# Patient Record
Sex: Male | Born: 1945 | Race: Black or African American | Hispanic: No | Marital: Married | Smoking: Former smoker
Health system: Southern US, Community
[De-identification: ages and names within clinical notes are randomized; demographics above are authoritative.]

## PROBLEM LIST (undated history)

## (undated) DIAGNOSIS — D509 Iron deficiency anemia, unspecified: Secondary | ICD-10-CM

## (undated) DIAGNOSIS — J441 Chronic obstructive pulmonary disease with (acute) exacerbation: Secondary | ICD-10-CM

## (undated) DIAGNOSIS — J449 Chronic obstructive pulmonary disease, unspecified: Secondary | ICD-10-CM

## (undated) DIAGNOSIS — I10 Essential (primary) hypertension: Secondary | ICD-10-CM

## (undated) DIAGNOSIS — F329 Major depressive disorder, single episode, unspecified: Secondary | ICD-10-CM

## (undated) DIAGNOSIS — N039 Chronic nephritic syndrome with unspecified morphologic changes: Secondary | ICD-10-CM

## (undated) DIAGNOSIS — D631 Anemia in chronic kidney disease: Secondary | ICD-10-CM

## (undated) DIAGNOSIS — N189 Chronic kidney disease, unspecified: Secondary | ICD-10-CM

## (undated) DIAGNOSIS — E119 Type 2 diabetes mellitus without complications: Secondary | ICD-10-CM

## (undated) DIAGNOSIS — R609 Edema, unspecified: Secondary | ICD-10-CM

## (undated) DIAGNOSIS — M545 Low back pain: Secondary | ICD-10-CM

## (undated) HISTORY — DX: Chronic obstructive pulmonary disease with (acute) exacerbation: J44.1

## (undated) HISTORY — DX: Chronic nephritic syndrome with unspecified morphologic changes: N03.9

## (undated) HISTORY — DX: Major depressive disorder, single episode, unspecified: F32.9

## (undated) HISTORY — DX: Edema, unspecified: R60.9

## (undated) HISTORY — DX: Iron deficiency anemia, unspecified: D50.9

## (undated) HISTORY — DX: Low back pain: M54.5

## (undated) HISTORY — DX: Anemia in chronic kidney disease: D63.1

## (undated) HISTORY — DX: Chronic obstructive pulmonary disease, unspecified: J44.9

## (undated) HISTORY — DX: Chronic kidney disease, unspecified: N18.9

## (undated) HISTORY — DX: Type 2 diabetes mellitus without complications: E11.9

## (undated) HISTORY — DX: Essential (primary) hypertension: I10

---

## 2002-12-21 ENCOUNTER — Ambulatory Visit (HOSPITAL_COMMUNITY): Admission: RE | Admit: 2002-12-21 | Discharge: 2002-12-21 | Payer: Self-pay | Admitting: Unknown Physician Specialty

## 2008-11-06 ENCOUNTER — Emergency Department (HOSPITAL_COMMUNITY): Admission: EM | Admit: 2008-11-06 | Discharge: 2008-11-06 | Payer: Self-pay | Admitting: Emergency Medicine

## 2008-11-18 ENCOUNTER — Ambulatory Visit (HOSPITAL_COMMUNITY): Admission: RE | Admit: 2008-11-18 | Discharge: 2008-11-18 | Payer: Self-pay | Admitting: Internal Medicine

## 2010-03-30 ENCOUNTER — Ambulatory Visit (HOSPITAL_COMMUNITY): Admission: RE | Admit: 2010-03-30 | Discharge: 2010-03-30 | Payer: Self-pay | Admitting: Internal Medicine

## 2010-04-18 ENCOUNTER — Emergency Department (HOSPITAL_COMMUNITY): Admission: EM | Admit: 2010-04-18 | Discharge: 2010-04-18 | Payer: Self-pay | Admitting: Emergency Medicine

## 2010-08-29 LAB — BLOOD GAS, ARTERIAL
Drawn by: 129711
O2 Content: 4 L/min
Patient temperature: 98.6
pCO2 arterial: 75.7 mmHg (ref 35.0–45.0)
pH, Arterial: 7.347 — ABNORMAL LOW (ref 7.350–7.450)

## 2010-09-24 LAB — BLOOD GAS, ARTERIAL
Acid-Base Excess: 8.6 mmol/L — ABNORMAL HIGH (ref 0.0–2.0)
Drawn by: 242311
O2 Content: 2 L/min
O2 Saturation: 97.8 %
TCO2: 35.6 mmol/L (ref 0–100)

## 2011-10-23 IMAGING — CR DG CHEST 2V
3 series · 3 of 3 positions shown · non-contrast
Comparison: 11/06/2008

CLINICAL DATA: Wheezing, shortness of breath, cough

CHEST - 2 VIEW

[w chest lat]
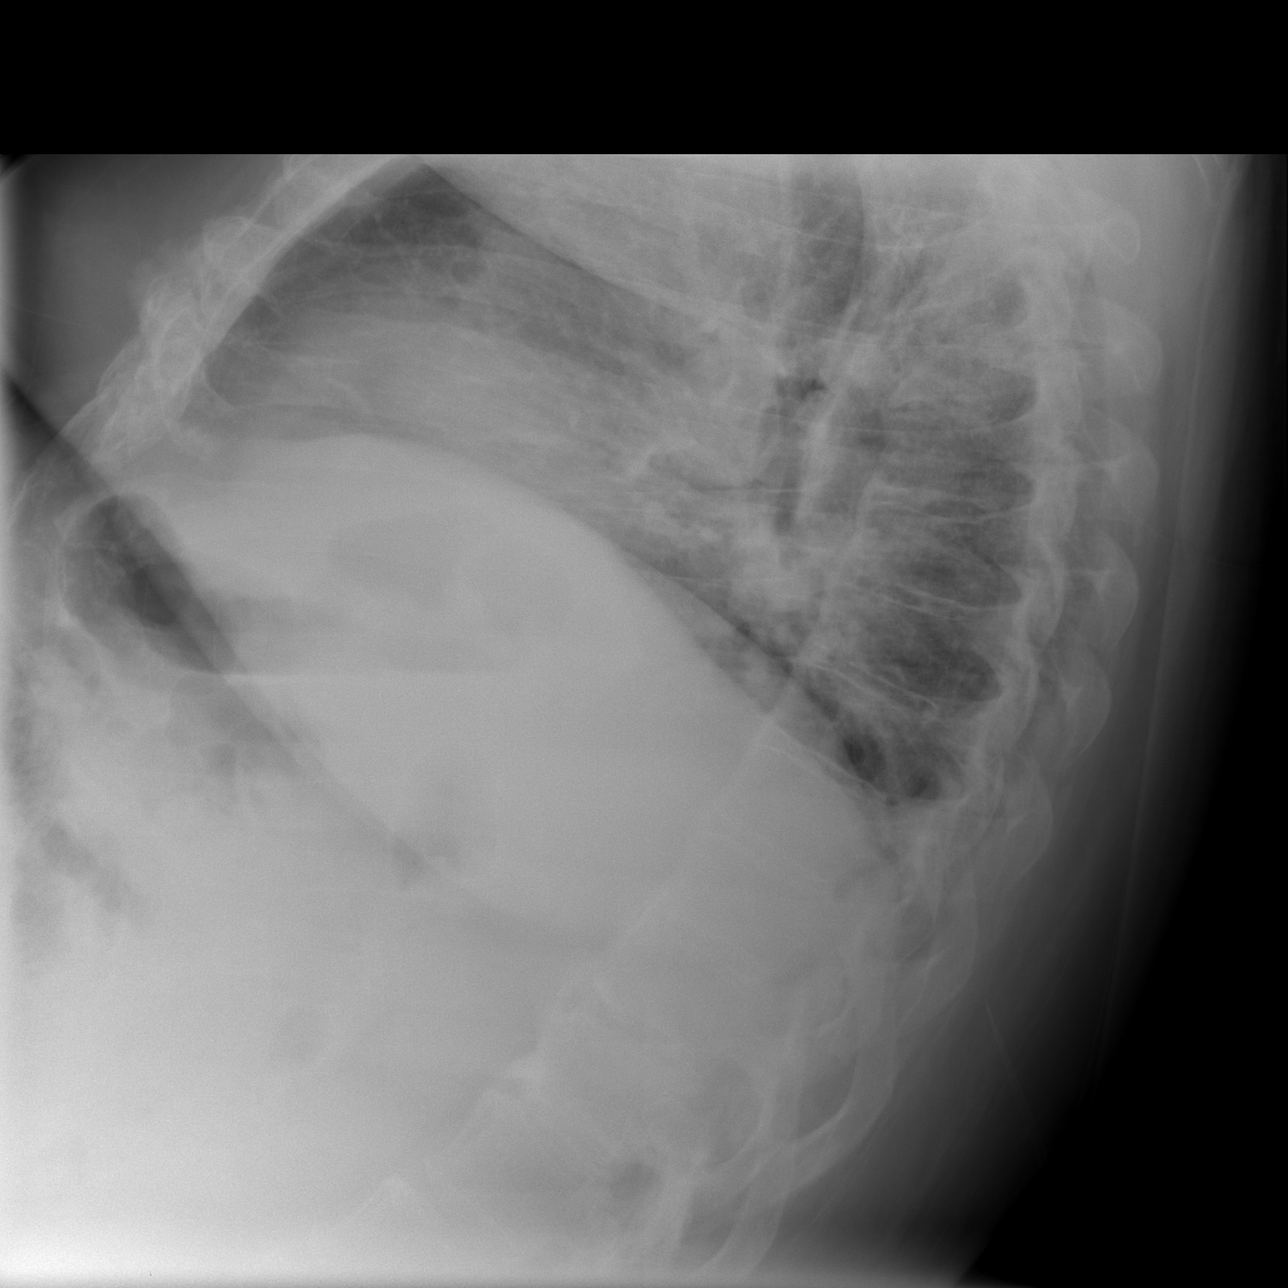

[view not recorded (1 of 2)]
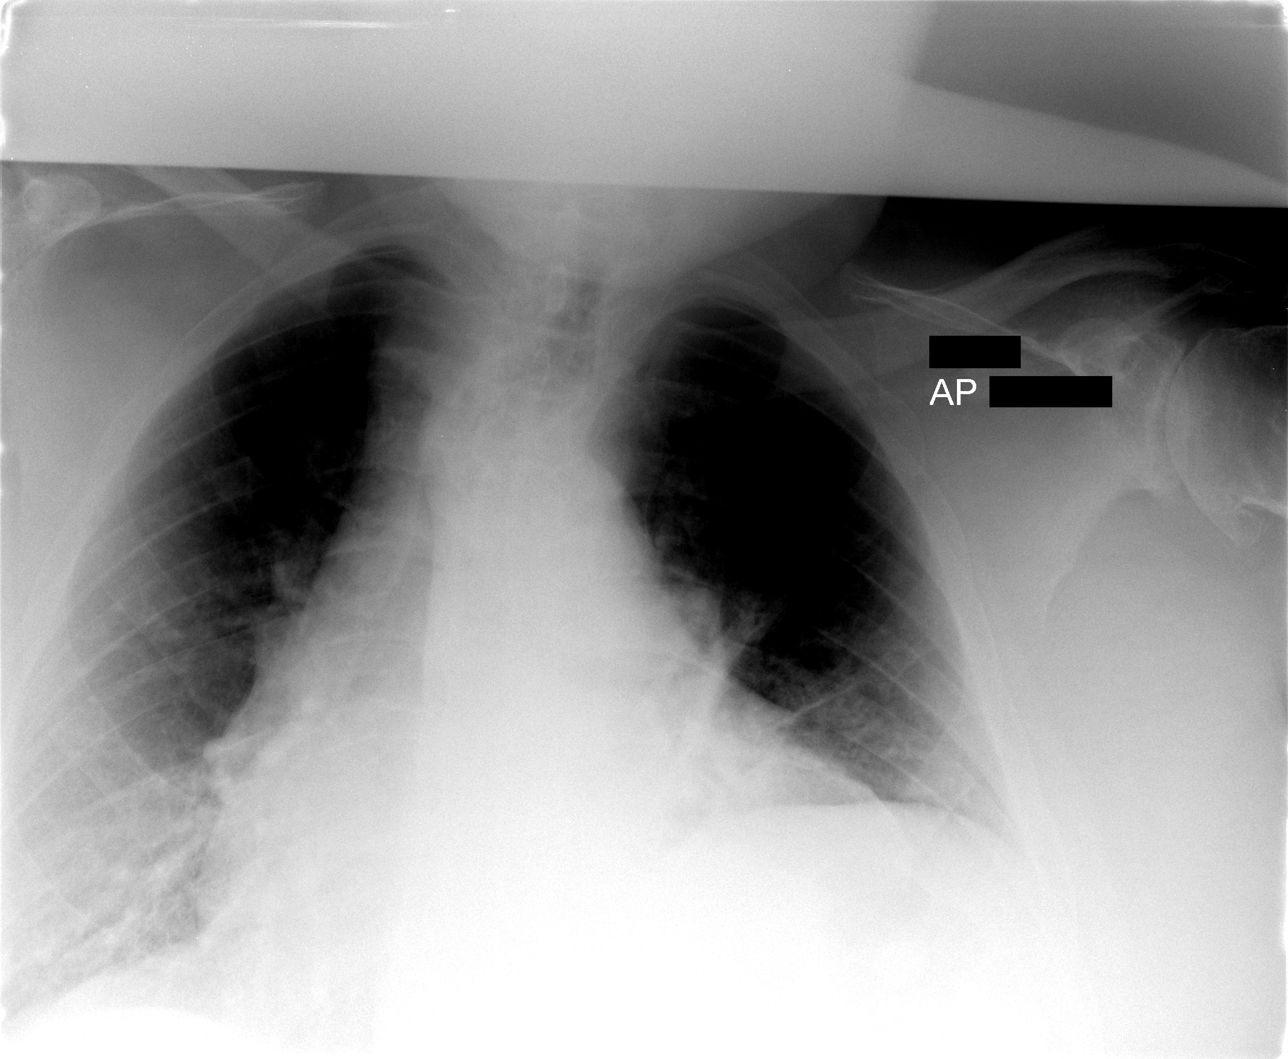

[view not recorded (2 of 2)]
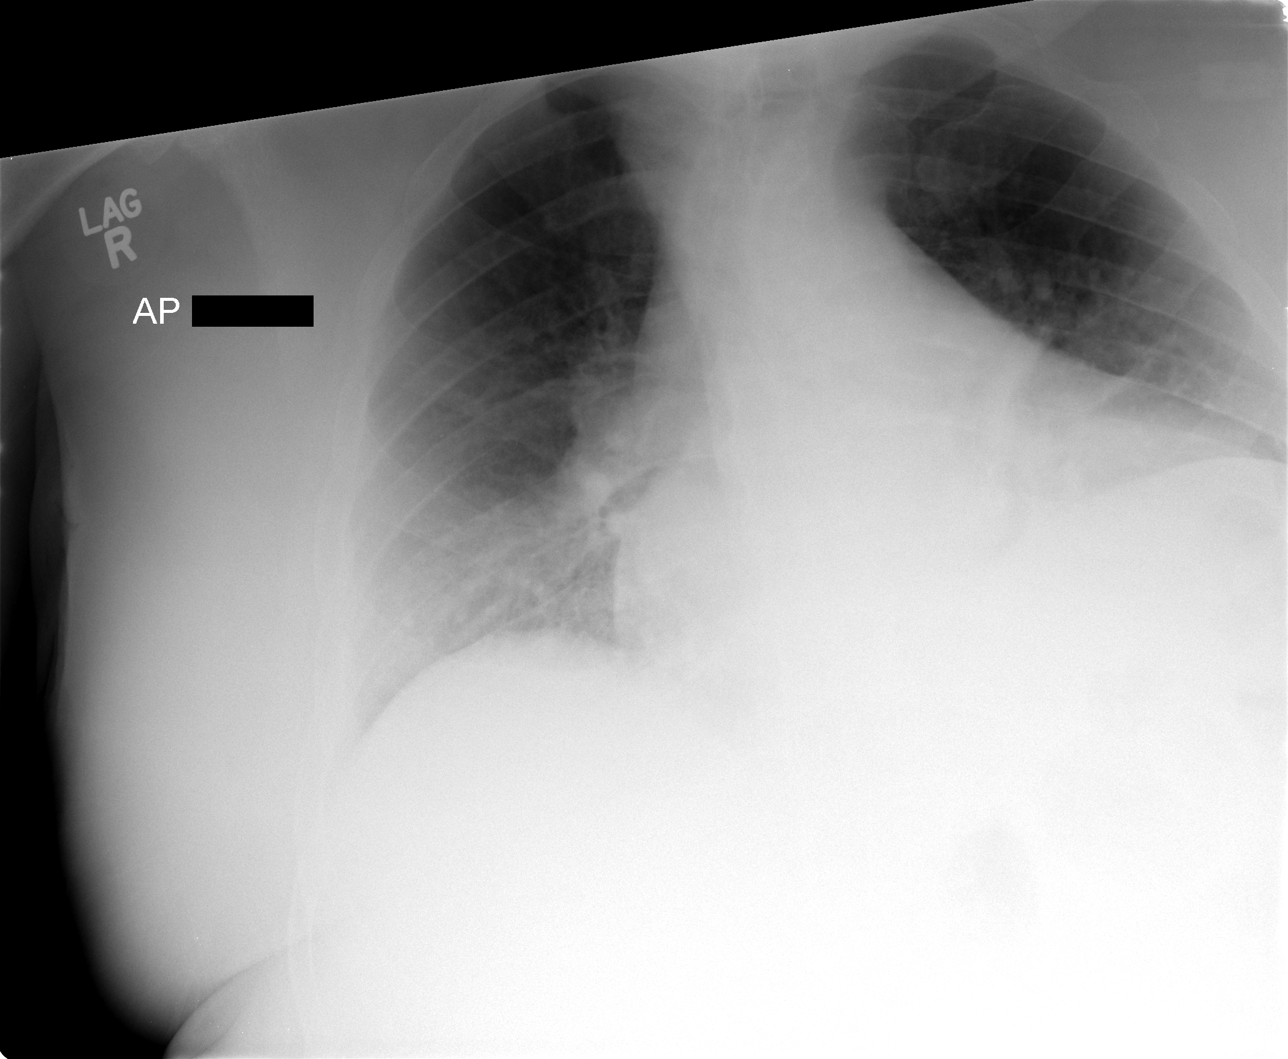

[3 of 3 positions shown; findings below may reference images not displayed]

FINDINGS: Enlargement of cardiac silhouette.
Tortuous aorta.
Pulmonary vascularity normal.
Low lung volumes with bibasilar atelectasis.
Upper lungs clear.
IMPRESSION: Enlargement of cardiac silhouette.
Low lung volumes with bibasilar atelectasis.

## 2012-09-21 ENCOUNTER — Non-Acute Institutional Stay (SKILLED_NURSING_FACILITY): Payer: Medicare Other | Admitting: Internal Medicine

## 2012-09-21 DIAGNOSIS — D638 Anemia in other chronic diseases classified elsewhere: Secondary | ICD-10-CM

## 2012-09-21 DIAGNOSIS — N179 Acute kidney failure, unspecified: Secondary | ICD-10-CM

## 2012-10-01 ENCOUNTER — Encounter: Payer: Self-pay | Admitting: Adult Health

## 2012-10-01 ENCOUNTER — Non-Acute Institutional Stay (SKILLED_NURSING_FACILITY): Payer: Medicare Other | Admitting: Adult Health

## 2012-10-01 DIAGNOSIS — E785 Hyperlipidemia, unspecified: Secondary | ICD-10-CM

## 2012-10-01 DIAGNOSIS — G4730 Sleep apnea, unspecified: Secondary | ICD-10-CM

## 2012-10-01 DIAGNOSIS — E119 Type 2 diabetes mellitus without complications: Secondary | ICD-10-CM

## 2012-10-01 DIAGNOSIS — K219 Gastro-esophageal reflux disease without esophagitis: Secondary | ICD-10-CM

## 2012-10-01 DIAGNOSIS — F329 Major depressive disorder, single episode, unspecified: Secondary | ICD-10-CM | POA: Insufficient documentation

## 2012-10-01 DIAGNOSIS — R609 Edema, unspecified: Secondary | ICD-10-CM

## 2012-10-01 DIAGNOSIS — F32A Depression, unspecified: Secondary | ICD-10-CM | POA: Insufficient documentation

## 2012-10-01 DIAGNOSIS — J441 Chronic obstructive pulmonary disease with (acute) exacerbation: Secondary | ICD-10-CM

## 2012-10-01 DIAGNOSIS — K5900 Constipation, unspecified: Secondary | ICD-10-CM | POA: Insufficient documentation

## 2012-10-01 DIAGNOSIS — J449 Chronic obstructive pulmonary disease, unspecified: Secondary | ICD-10-CM

## 2012-10-01 DIAGNOSIS — J4489 Other specified chronic obstructive pulmonary disease: Secondary | ICD-10-CM

## 2012-10-01 DIAGNOSIS — N039 Chronic nephritic syndrome with unspecified morphologic changes: Secondary | ICD-10-CM

## 2012-10-01 DIAGNOSIS — I10 Essential (primary) hypertension: Secondary | ICD-10-CM

## 2012-10-01 DIAGNOSIS — D631 Anemia in chronic kidney disease: Secondary | ICD-10-CM

## 2012-10-01 HISTORY — DX: Essential (primary) hypertension: I10

## 2012-10-01 HISTORY — DX: Chronic obstructive pulmonary disease with (acute) exacerbation: J44.1

## 2012-10-05 NOTE — Progress Notes (Signed)
Patient ID: Xavier Gibson, male   DOB: 1945/12/06, 67 y.o.   MRN: [id number]        PROGRESS NOTE  DATE:   09/21/2012  FACILITY:  [facility]   LEVEL OF CARE: SNF  Acute Visit  CHIEF COMPLAINT:  Manage acute renal failure and anemia of chronic disease.    HISTORY OF PRESENT ILLNESS: I was requested by the staff to assess the patient regarding above problem(s):  ACUTE RENAL FAILURE:  On 09/18/2012, patient's BUN was 40, creatinine 1.57.  In 06/2012, BUN was 27, creatinine 1.45.  Patient denies increasing lower extremity swelling or confusion.  He does have chronic lower extremity swelling.    ANEMIA:  On 09/18/2012, patient's hemoglobin was 9.3, MCV 91.  In 03/2012, hemoglobin was 9.  The anemia has been stable. The patient denies fatigue, melena or hematochezia. No complications from the medications currently being used.    PAST MEDICAL HISTORY : Reviewed.  No changes.  CURRENT MEDICATIONS: Reviewed per [facility]  REVIEW OF SYSTEMS:  GENERAL: no change in appetite, no fatigue, no weight changes, no fever, chills or weakness RESPIRATORY: no cough, SOB, DOE,, wheezing, hemoptysis CARDIAC: no chest pain, palpitations, chronic lower extremity swelling present GI: no abdominal pain, diarrhea, constipation, heart burn, nausea or vomiting  PHYSICAL EXAMINATION  GENERAL: no acute distress, morbidly obese body habitus EYES: conjunctivae normal, sclerae normal, normal eye lids NECK: supple, trachea midline, no neck masses, no thyroid tenderness, no thyromegaly LYMPHATICS: no LAN in the neck, no supraclavicular LAN RESPIRATORY: breathing is even & unlabored, BS CTAB CARDIAC: RRR, no murmur,no extra heart sounds  EDEMA/VARICOSITIES:  +4 bilateral lower extremity edema  ARTERIAL:  pedal pulses nonpalpable  GI: abdomen soft, normal BS, no masses, no tenderness, no hepatomegaly, no splenomegaly PSYCHIATRIC: the patient is alert & oriented to person, affect & behavior  appropriate  ASSESSMENT/PLAN:  Acute renal failure.  New problem.  Reassess.   Anemia of chronic disease.  Hemoglobin improved.  Continue iron.  CPT CODE: 40981

## 2012-10-19 ENCOUNTER — Non-Acute Institutional Stay (SKILLED_NURSING_FACILITY): Payer: Medicare Other | Admitting: Internal Medicine

## 2012-10-19 DIAGNOSIS — E119 Type 2 diabetes mellitus without complications: Secondary | ICD-10-CM

## 2012-10-19 DIAGNOSIS — J449 Chronic obstructive pulmonary disease, unspecified: Secondary | ICD-10-CM

## 2012-10-19 DIAGNOSIS — D509 Iron deficiency anemia, unspecified: Secondary | ICD-10-CM

## 2012-10-19 DIAGNOSIS — F329 Major depressive disorder, single episode, unspecified: Secondary | ICD-10-CM

## 2012-11-04 DIAGNOSIS — E119 Type 2 diabetes mellitus without complications: Secondary | ICD-10-CM

## 2012-11-04 DIAGNOSIS — J4489 Other specified chronic obstructive pulmonary disease: Secondary | ICD-10-CM | POA: Insufficient documentation

## 2012-11-04 DIAGNOSIS — D509 Iron deficiency anemia, unspecified: Secondary | ICD-10-CM | POA: Insufficient documentation

## 2012-11-04 DIAGNOSIS — J449 Chronic obstructive pulmonary disease, unspecified: Secondary | ICD-10-CM

## 2012-11-04 DIAGNOSIS — F329 Major depressive disorder, single episode, unspecified: Secondary | ICD-10-CM

## 2012-11-04 HISTORY — DX: Type 2 diabetes mellitus without complications: E11.9

## 2012-11-04 HISTORY — DX: Iron deficiency anemia, unspecified: D50.9

## 2012-11-04 HISTORY — DX: Chronic obstructive pulmonary disease, unspecified: J44.9

## 2012-11-04 HISTORY — DX: Major depressive disorder, single episode, unspecified: F32.9

## 2012-11-04 NOTE — Progress Notes (Signed)
Patient ID: Xavier Gibson, male   DOB: 15-Aug-1945, 67 y.o.   MRN: [id number]        PROGRESS NOTE  DATE: 10/19/2012  FACILITY: Nursing Home Location: [facility]  LEVEL OF CARE: SNF (31)  Routine Visit  CHIEF COMPLAINT:  Manage depression, COPD, and diabetes mellitus.    HISTORY OF PRESENT ILLNESS:  REASSESSMENT OF ONGOING PROBLEM(S):  DEPRESSION: I was requested by the pharmacy consultant to assess the patient for possible dose reduction of Celexa 20 mg q.d.  Patient states he is intermittently depressed.  The depression remains stable. Patient denies ongoing feelings of sadness, insomnia, anedhonia or lack of appetite. No complications reported from the medications currently being used. Staff do not report behavioral problems.   COPD: the COPD remains stable.  Pt denies sob, cough, wheezing or declining exercise tolerance.  No complications from the medications presently being used.   DM:pt's DM remains stable.  Pt denies polyuria, polydipsia, polyphagia, changes in vision or hypoglycemic episodes.  No complications noted from the medication presently being used.  Last hemoglobin A1c is: 09/2012:  Hemoglobin A1C 6.6  PAST MEDICAL HISTORY : Reviewed.  No changes.  CURRENT MEDICATIONS: Reviewed per [facility]  REVIEW OF SYSTEMS:  GENERAL: no change in appetite, no fatigue, no weight changes, no fever, chills or weakness RESPIRATORY: no cough, SOB, DOE, wheezing, hemoptysis CARDIAC: no chest pain or palpitations;  chronic lower extremity swelling  GI: no abdominal pain, diarrhea, constipation, heart burn, nausea or vomiting  PHYSICAL EXAMINATION  VS:  T 97.2      P 70     RR 20    BP 134/70     POX %     WT (Lb) 407  GENERAL: no acute distress, morbidly obese body habitus EYES: conjunctivae normal, sclerae normal, normal eye lids NECK: supple, trachea midline, no neck masses, no thyroid tenderness, no thyromegaly LYMPHATICS: no LAN in the neck, no supraclavicular  LAN RESPIRATORY: breathing is even & unlabored, BS CTAB CARDIAC: RRR, no murmur,no extra heart sounds EDEMA/VARICOSITIES:  +4 bilateral lower extremity edema  ARTERIAL:  pedal pulses nonpalpable  GI: abdomen soft, normal BS, no masses, no tenderness, no hepatomegaly, no splenomegaly PSYCHIATRIC: the patient is alert & oriented to person, affect & behavior appropriate  LABS/RADIOLOGY: 09/2012:  BUN 33, creatinine 1.47.    Hemoglobin 9.3, MCV 91, otherwise CBC normal.    Glucose 152, BUN 40, creatinine 1.57, otherwise CMP normal.    07/2012:  Fasting lipid panel normal.    ASSESSMENT/PLAN:  Diabetes mellitus.  Well controlled.    Depression.  Stable.  Patient wishes to be kept on current Celexa dose.    COPD.  Stable.   Iron deficiency anemia.  Stable.   Hypertension.  Well controlled.    Allergic rhinitis.  Well controlled.    GERD.  Well controlled.     Constipation.  Well controlled.    CPT CODE: 40981

## 2012-11-23 ENCOUNTER — Non-Acute Institutional Stay (SKILLED_NURSING_FACILITY): Payer: Medicare Other | Admitting: Internal Medicine

## 2012-11-23 DIAGNOSIS — D509 Iron deficiency anemia, unspecified: Secondary | ICD-10-CM

## 2012-11-23 DIAGNOSIS — R609 Edema, unspecified: Secondary | ICD-10-CM

## 2012-11-23 DIAGNOSIS — J449 Chronic obstructive pulmonary disease, unspecified: Secondary | ICD-10-CM

## 2012-11-23 DIAGNOSIS — E119 Type 2 diabetes mellitus without complications: Secondary | ICD-10-CM

## 2012-11-26 NOTE — Progress Notes (Signed)
Patient ID: Xavier Gibson, male   DOB: 1946-02-27, 67 y.o.   MRN: [id number]        PROGRESS NOTE  DATE: 11/23/2012  FACILITY: Nursing Home Location: [facility]  LEVEL OF CARE: SNF (31)  Routine Visit  CHIEF COMPLAINT:  Manage edema, COPD, and diabetes mellitus.    HISTORY OF PRESENT ILLNESS:  REASSESSMENT OF ONGOING PROBLEM(S):  EDEMA: The patient's edema is stable. Patient c/o increasing lower extremity swelling.  denies calf pain, chest pain or shortness of breath. No complications reported from the medications currently being used.  COPD: the COPD remains stable.  Pt denies sob, cough, wheezing or declining exercise tolerance.  No complications from the medications presently being used.   DM:pt's DM remains stable.  Pt denies polyuria, polydipsia, polyphagia, changes in vision or hypoglycemic episodes.  No complications noted from the medication presently being used.  Last hemoglobin A1c is: 09/2012:  Hemoglobin A1C 6.6  PAST MEDICAL HISTORY : Reviewed.  No changes.  CURRENT MEDICATIONS: Reviewed per [facility]  REVIEW OF SYSTEMS:  GENERAL: no change in appetite, no fatigue, no weight changes, no fever, chills or weakness RESPIRATORY: no cough, SOB, DOE, wheezing, hemoptysis CARDIAC: no chest pain or palpitations;  chronic lower extremity swelling  GI: no abdominal pain, diarrhea, constipation, heart burn, nausea or vomiting  PHYSICAL EXAMINATION  VS:  T 98.8      P 70     RR 18    BP 140/76     POX %     WT (Lb) 408  GENERAL: no acute distress, morbidly obese body habitus EYES: conjunctivae normal, sclerae normal, normal eye lids NECK: supple, trachea midline, no neck masses, no thyroid tenderness, no thyromegaly LYMPHATICS: no LAN in the neck, no supraclavicular LAN RESPIRATORY: breathing is even & unlabored, BS CTAB CARDIAC: RRR, no murmur,no extra heart sounds EDEMA/VARICOSITIES:  +4 bilateral lower extremity edema  ARTERIAL:  pedal pulses nonpalpable   GI: abdomen soft, normal BS, no masses, no tenderness, no hepatomegaly, no splenomegaly PSYCHIATRIC: the patient is alert & oriented to person, affect & behavior appropriate  LABS/RADIOLOGY: 09/2012:  BUN 33, creatinine 1.47.    Hemoglobin 9.3, MCV 91, otherwise CBC normal.    Glucose 152, BUN 40, creatinine 1.57, otherwise CMP normal.    07/2012:  Fasting lipid panel normal.    ASSESSMENT/PLAN:  Diabetes mellitus.  Well controlled.    Edema-uncontrolled problem. Increase Lasix to 80 mg twice a day for 4 days then decrease to 60 mg twice a day.  Depression.  Stable.   COPD.  Stable.   Iron deficiency anemia.  Stable.   Hypertension.  Well controlled.    Allergic rhinitis.  Well controlled.    GERD.  Well controlled.     Constipation.  Well controlled.    CPT CODE: 40981

## 2013-01-06 ENCOUNTER — Non-Acute Institutional Stay (SKILLED_NURSING_FACILITY): Payer: Medicare Other | Admitting: Internal Medicine

## 2013-01-06 DIAGNOSIS — J449 Chronic obstructive pulmonary disease, unspecified: Secondary | ICD-10-CM

## 2013-01-06 DIAGNOSIS — R609 Edema, unspecified: Secondary | ICD-10-CM

## 2013-01-06 DIAGNOSIS — F329 Major depressive disorder, single episode, unspecified: Secondary | ICD-10-CM

## 2013-01-06 DIAGNOSIS — E119 Type 2 diabetes mellitus without complications: Secondary | ICD-10-CM

## 2013-01-06 HISTORY — DX: Edema, unspecified: R60.9

## 2013-01-06 NOTE — Progress Notes (Signed)
Patient ID: Xavier Gibson, male   DOB: 06-09-1946, 67 y.o.   MRN: [id number]        PROGRESS NOTE  DATE: 01/06/2013  FACILITY: Nursing Home Location: [facility]  LEVEL OF CARE: SNF (31)  Routine Visit  CHIEF COMPLAINT:  Manage edema, COPD, and diabetes mellitus.    HISTORY OF PRESENT ILLNESS:  REASSESSMENT OF ONGOING PROBLEM(S):  EDEMA: The patient's edema is stable. Patient c/o increasing lower extremity swelling.  denies calf pain, chest pain or shortness of breath. No complications reported from the medications currently being used.  COPD: the COPD remains stable.  Pt denies sob, cough, wheezing or declining exercise tolerance.  No complications from the medications presently being used.   DM:pt's DM remains stable.  Pt denies polyuria, polydipsia, polyphagia, changes in vision or hypoglycemic episodes.  No complications noted from the medication presently being used.  Last hemoglobin A1c is: 09/2012:  Hemoglobin A1C 6.6  PAST MEDICAL HISTORY : Reviewed.  No changes.  CURRENT MEDICATIONS: Reviewed per [facility]  REVIEW OF SYSTEMS:  GENERAL: no change in appetite, no fatigue, no weight changes, no fever, chills or weakness RESPIRATORY: no cough, SOB, DOE, wheezing, hemoptysis CARDIAC: no chest pain or palpitations;  chronic lower extremity swelling  GI: no abdominal pain, diarrhea, constipation, heart burn, nausea or vomiting  PHYSICAL EXAMINATION  VS:  T 99.1      P 78    RR 20    BP 133/62    POX %     WT (Lb) 413  GENERAL: no acute distress, morbidly obese body habitus EYES: conjunctivae normal, sclerae normal, normal eye lids NECK: supple, trachea midline, no neck masses, no thyroid tenderness, no thyromegaly LYMPHATICS: no LAN in the neck, no supraclavicular LAN RESPIRATORY: breathing is even & unlabored, BS CTAB CARDIAC: RRR, no murmur,no extra heart sounds EDEMA/VARICOSITIES:  +4 bilateral lower extremity edema  ARTERIAL:  pedal pulses nonpalpable   GI: abdomen soft, normal BS, no masses, no tenderness, no hepatomegaly, no splenomegaly PSYCHIATRIC: the patient is alert & oriented to person, affect & behavior appropriate  LABS/RADIOLOGY: 09/2012:  BUN 33, creatinine 1.47.    Hemoglobin 9.3, MCV 91, otherwise CBC normal.    Glucose 152, BUN 40, creatinine 1.57, otherwise CMP normal.    07/2012:  Fasting lipid panel normal.    ASSESSMENT/PLAN:  Diabetes mellitus.  Well controlled.    Edema-stable.  Depression.  Stable.   COPD.  Stable.   Iron deficiency anemia.  Stable.   Hypertension.  Well controlled.    Allergic rhinitis.  Well controlled.    GERD.  Well controlled.     Constipation.  Well controlled.    CPT CODE: 28413

## 2013-01-27 ENCOUNTER — Non-Acute Institutional Stay (SKILLED_NURSING_FACILITY): Payer: Medicare Other | Admitting: Internal Medicine

## 2013-01-27 DIAGNOSIS — J449 Chronic obstructive pulmonary disease, unspecified: Secondary | ICD-10-CM

## 2013-01-27 DIAGNOSIS — E119 Type 2 diabetes mellitus without complications: Secondary | ICD-10-CM

## 2013-01-27 DIAGNOSIS — R609 Edema, unspecified: Secondary | ICD-10-CM

## 2013-01-27 DIAGNOSIS — M545 Low back pain, unspecified: Secondary | ICD-10-CM

## 2013-01-27 DIAGNOSIS — J4489 Other specified chronic obstructive pulmonary disease: Secondary | ICD-10-CM

## 2013-01-29 DIAGNOSIS — M545 Low back pain, unspecified: Secondary | ICD-10-CM

## 2013-01-29 HISTORY — DX: Low back pain, unspecified: M54.50

## 2013-01-29 NOTE — Progress Notes (Signed)
Patient ID: Xavier Gibson, male   DOB: 1945/07/25, 67 y.o.   MRN: [id number]        PROGRESS NOTE  DATE: 01/27/2013  FACILITY: Nursing Home Location: [facility]  LEVEL OF CARE: SNF (31)  Routine Visit  CHIEF COMPLAINT:  Manage back pain,  edema, COPD, and diabetes mellitus.    HISTORY OF PRESENT ILLNESS:  REASSESSMENT OF ONGOING PROBLEM(S):  EDEMA: The patient's edema is stable. Patient c/o increasing lower extremity swelling.  denies calf pain, chest pain or shortness of breath. No complications reported from the medications currently being used.  COPD: the COPD remains stable.  Pt denies sob, cough, wheezing or declining exercise tolerance.  No complications from the medications presently being used.   DM:pt's DM remains stable.  Pt denies polyuria, polydipsia, polyphagia, changes in vision or hypoglycemic episodes.  No complications noted from the medication presently being used.  Last hemoglobin A1c is: 09/2012:  Hemoglobin A1C 6.6  LOW BACK PAIN: New problem.  Patient complains of ongoing back pain, but denies stiffness, radiation, lower extremity weakness, or paresthesias. He is requesting routine Tylenol.  PAST MEDICAL HISTORY : Reviewed.  No changes.  CURRENT MEDICATIONS: Reviewed per [facility]  REVIEW OF SYSTEMS:  GENERAL: no change in appetite, no fatigue, no weight changes, no fever, chills or weakness RESPIRATORY: no cough, SOB, DOE, wheezing, hemoptysis CARDIAC: no chest pain or palpitations;  chronic lower extremity swelling  GI: no abdominal pain, diarrhea, constipation, heart burn, nausea or vomiting  PHYSICAL EXAMINATION  VS:  T 97.8      P 68    RR 20    BP 152/82    POX %     WT (Lb) 418  GENERAL: no acute distress, morbidly obese body habitus EYES: conjunctivae normal, sclerae normal, normal eye lids NECK: supple, trachea midline, no neck masses, no thyroid tenderness, no thyromegaly LYMPHATICS: no LAN in the neck, no supraclavicular  LAN RESPIRATORY: breathing is even & unlabored, BS CTAB CARDIAC: RRR, no murmur,no extra heart sounds EDEMA/VARICOSITIES:  +4 bilateral lower extremity edema  ARTERIAL:  pedal pulses nonpalpable  GI: abdomen soft, normal BS, no masses, no tenderness, no hepatomegaly, no splenomegaly PSYCHIATRIC: the patient is alert & oriented to person, affect & behavior appropriate  LABS/RADIOLOGY: 09/2012:  BUN 33, creatinine 1.47.    Hemoglobin 9.3, MCV 91, otherwise CBC normal.    Glucose 152, BUN 40, creatinine 1.57, otherwise CMP normal.    07/2012:  Fasting lipid panel normal.    ASSESSMENT/PLAN:  Diabetes mellitus.  Well controlled.    Edema-stable.  Low back pain-new problem. Start Tylenol 650 mg twice a day  Depression.  Stable.   COPD.  Stable.   Iron deficiency anemia.  Stable.   Hypertension.  BP elevated. Will review a log.  Allergic rhinitis.  Well controlled.    GERD.  Well controlled.     Constipation.  Well controlled.    CPT CODE: 59563

## 2013-03-01 ENCOUNTER — Non-Acute Institutional Stay (SKILLED_NURSING_FACILITY): Payer: Medicare Other | Admitting: Internal Medicine

## 2013-03-01 DIAGNOSIS — M545 Low back pain: Secondary | ICD-10-CM

## 2013-03-01 DIAGNOSIS — E119 Type 2 diabetes mellitus without complications: Secondary | ICD-10-CM

## 2013-03-01 DIAGNOSIS — R609 Edema, unspecified: Secondary | ICD-10-CM

## 2013-03-01 DIAGNOSIS — J449 Chronic obstructive pulmonary disease, unspecified: Secondary | ICD-10-CM

## 2013-03-01 NOTE — Progress Notes (Signed)
Patient ID: Xavier Gibson, male   DOB: [birth year], 67 y.o.   MRN: [id number]        PROGRESS NOTE  DATE: 03/01/2013  FACILITY: Nursing Home Location: [facility]  LEVEL OF CARE: SNF (31)  Routine Visit  CHIEF COMPLAINT:  Manage edema, COPD, and diabetes mellitus.    HISTORY OF PRESENT ILLNESS:  REASSESSMENT OF ONGOING PROBLEM(S):  EDEMA: The patient's edema is stable. Patient denies increasing lower extremity swelling.  denies calf pain, chest pain or shortness of breath. No complications reported from the medications currently being used.  COPD: the COPD remains stable.  Pt denies sob, wheezing or declining exercise tolerance.  No complications from the medications presently being used. Patient is complaining of an increase in productive cough for several days. He cannot identify precipitating or alleviating factors. There is no temporal relationship.  DM:pt's DM remains stable.  Pt denies polyuria, polydipsia, polyphagia, changes in vision or hypoglycemic episodes.  No complications noted from the medication presently being used.  Last hemoglobin A1c is: 09/2012:  Hemoglobin A1C 6.6  PAST MEDICAL HISTORY : Reviewed.  No changes.  CURRENT MEDICATIONS: Reviewed per [facility]  REVIEW OF SYSTEMS:  GENERAL: no change in appetite, no fatigue, no weight changes, no fever, chills or weakness RESPIRATORY: no, SOB, DOE, wheezing, hemoptysis, see HPI CARDIAC: no chest pain or palpitations;  chronic lower extremity swelling  GI: no abdominal pain, diarrhea, constipation, heart burn, nausea or vomiting  PHYSICAL EXAMINATION  VS:  T 98.2      P 76    RR 20    BP 144/82    POX %     WT (Lb) 423  GENERAL: no acute distress, morbidly obese body habitus EYES: conjunctivae normal, sclerae normal, normal eye lids NECK: supple, trachea midline, no neck masses, no thyroid tenderness, no thyromegaly LYMPHATICS: no LAN in the neck, no supraclavicular LAN RESPIRATORY: breathing is even &  unlabored, BS CTAB CARDIAC: RRR, no murmur,no extra heart sounds EDEMA/VARICOSITIES:  +4 bilateral lower extremity edema  ARTERIAL:  pedal pulses nonpalpable  GI: abdomen soft, normal BS, no masses, no tenderness, no hepatomegaly, no splenomegaly PSYCHIATRIC: the patient is alert & oriented to person, affect & behavior appropriate  LABS/RADIOLOGY: 09/2012:  BUN 33, creatinine 1.47.    Hemoglobin 9.3, MCV 91, otherwise CBC normal.    Glucose 152, BUN 40, creatinine 1.57, otherwise CMP normal.    07/2012:  Fasting lipid panel normal.    ASSESSMENT/PLAN:  Diabetes mellitus.  Well controlled.    Edema-stable.  Low back pain-denies pain.  Depression.  Stable.   COPD.  Stable. Start Mucinex 600 mg twice a day for one week for increased productive cough.  Iron deficiency anemia.  Stable.   Hypertension.  BP borderline. Will monitor.  Allergic rhinitis.  Well controlled.    GERD.  Well controlled.     Constipation.  Well controlled.    CPT CODE: 40981

## 2013-03-29 ENCOUNTER — Non-Acute Institutional Stay (SKILLED_NURSING_FACILITY): Payer: Medicare Other | Admitting: Internal Medicine

## 2013-03-29 DIAGNOSIS — N189 Chronic kidney disease, unspecified: Secondary | ICD-10-CM

## 2013-03-29 DIAGNOSIS — D631 Anemia in chronic kidney disease: Secondary | ICD-10-CM

## 2013-04-06 ENCOUNTER — Non-Acute Institutional Stay (SKILLED_NURSING_FACILITY): Payer: Medicare Other | Admitting: Internal Medicine

## 2013-04-06 DIAGNOSIS — J441 Chronic obstructive pulmonary disease with (acute) exacerbation: Secondary | ICD-10-CM

## 2013-04-06 DIAGNOSIS — E119 Type 2 diabetes mellitus without complications: Secondary | ICD-10-CM

## 2013-04-06 DIAGNOSIS — M545 Low back pain: Secondary | ICD-10-CM

## 2013-04-06 DIAGNOSIS — R609 Edema, unspecified: Secondary | ICD-10-CM

## 2013-04-07 NOTE — Progress Notes (Signed)
Patient ID: Xavier Gibson, male   DOB: December 23, 1945, 67 y.o.   MRN: [id number]        PROGRESS NOTE  DATE: 04/06/2013  FACILITY: Nursing Home Location: [facility]  LEVEL OF CARE: SNF (31)  Routine Visit  CHIEF COMPLAINT:  Manage edema, COPD, and diabetes mellitus.    HISTORY OF PRESENT ILLNESS:  REASSESSMENT OF ONGOING PROBLEM(S):  EDEMA: The patient's edema is stable. Patient denies increasing lower extremity swelling.  denies calf pain, chest pain or shortness of breath. No complications reported from the medications currently being used.  COPD: the COPD remains stable.  Pt denies sob, wheezing or declining exercise tolerance.  No complications from the medications presently being used. Patient is complaining of an increase in productive cough for several days. He cannot identify precipitating or alleviating factors. There is no temporal relationship.  DM:pt's DM remains stable.  Pt denies polyuria, polydipsia, polyphagia, changes in vision or hypoglycemic episodes.  No complications noted from the medication presently being used.  Last hemoglobin A1c is: 09/2012:  Hemoglobin A1C 6.6, in 10/14 HbA1c 5.9.  PAST MEDICAL HISTORY : Reviewed.  No changes.  CURRENT MEDICATIONS: Reviewed per [facility]  REVIEW OF SYSTEMS:  GENERAL: no change in appetite, no fatigue, no weight changes, no fever, chills or weakness RESPIRATORY: no, SOB, DOE, wheezing, hemoptysis, see HPI CARDIAC: no chest pain or palpitations;  chronic lower extremity swelling  GI: no abdominal pain, diarrhea, constipation, heart burn, nausea or vomiting  PHYSICAL EXAMINATION  VS:  T 96.4      P 65    RR 18    BP 140/64    POX %     WT (Lb) 424  GENERAL: no acute distress, morbidly obese body habitus EYES: conjunctivae normal, sclerae normal, normal eye lids NECK: supple, trachea midline, no neck masses, no thyroid tenderness, no thyromegaly LYMPHATICS: no LAN in the neck, no supraclavicular  LAN RESPIRATORY: breathing is even & unlabored, BS CTAB CARDIAC: RRR, no murmur,no extra heart sounds EDEMA/VARICOSITIES:  +4 bilateral lower extremity edema  ARTERIAL:  pedal pulses nonpalpable  GI: abdomen soft, normal BS, no masses, no tenderness, no hepatomegaly, no splenomegaly PSYCHIATRIC: the patient is alert & oriented to person, affect & behavior appropriate  LABS/RADIOLOGY:  10-14 Hb 9.2, mcv 91 ow cbc nl, bun 28, cr 1.44 ow cmp nl, FLP nl 09/2012:  BUN 33, creatinine 1.47.    Hemoglobin 9.3, MCV 91, otherwise CBC normal.    Glucose 152, BUN 40, creatinine 1.57, otherwise CMP normal.    07/2012:  Fasting lipid panel normal.    ASSESSMENT/PLAN:  Diabetes mellitus.  Well controlled.    Edema-stable.  Low back pain-denies pain.  Depression.  Stable.   COPD.  Stable.   Iron deficiency anemia.  Stable.   Hypertension.  Uncontrolled.  Increase clonidine to 0.2 bid.  Allergic rhinitis.  Well controlled.    GERD.  Well controlled.     Constipation.  Well controlled.    CPT CODE: 40981

## 2013-04-22 DIAGNOSIS — N189 Chronic kidney disease, unspecified: Secondary | ICD-10-CM | POA: Insufficient documentation

## 2013-04-22 DIAGNOSIS — D631 Anemia in chronic kidney disease: Secondary | ICD-10-CM | POA: Insufficient documentation

## 2013-04-22 HISTORY — DX: Chronic kidney disease, unspecified: N18.9

## 2013-04-22 HISTORY — DX: Anemia in chronic kidney disease: D63.1

## 2013-04-22 NOTE — Progress Notes (Signed)
Patient ID: Xavier Gibson, male   DOB: Oct 30, 1945, 67 y.o.   MRN: [id number]        PROGRESS NOTE  DATE: 03/29/2013     FACILITY:  [facility]  LEVEL OF CARE: SNF (31)  Acute Visit  CHIEF COMPLAINT:  Manage anemia of chronic kidney disease and chronic kidney disease.     HISTORY OF PRESENT ILLNESS: I was requested by the staff to assess the patient regarding above problem(s):  ANEMIA: The anemia has been stable. The patient denies fatigue, melena or hematochezia. No complications from the medications currently being used.  The patient's hemoglobin is 9.2, MCV 91.  In 09/2012:  Hemoglobin 9.3.    CHRONIC KIDNEY DISEASE: The patient's chronic kidney disease remains stable.  Patient denies increasing lower extremity swelling or confusion. Last BUN and creatinine are:   On 03/24/2013:  BUN 28, creatinine 1.44.  In 09/2012:  BUN 33, creatinine 1.47.    PAST MEDICAL HISTORY : Reviewed.  No changes.  CURRENT MEDICATIONS: Reviewed per [facility]  REVIEW OF SYSTEMS:  GENERAL: no change in appetite, no fatigue, no weight changes, no fever, chills or weakness RESPIRATORY: no cough, SOB, DOE,, wheezing, hemoptysis CARDIAC: no chest pain or palpitations; patient has  chronic lower extremity swelling   GI: no abdominal pain, diarrhea, constipation, heart burn, nausea or vomiting  PHYSICAL EXAMINATION  GENERAL: no acute distress, morbidly obese body habitus NECK: supple, trachea midline, no neck masses, no thyroid tenderness, no thyromegaly RESPIRATORY: breathing is even & unlabored, BS CTAB CARDIAC: RRR, no murmur,no extra heart sounds   EDEMA/VARICOSITIES:  +4 bilateral lower extremity edema  ARTERIAL:  pedal pulses nonpalpable    GI: abdomen soft, normal BS, no masses, no tenderness, no hepatomegaly, no splenomegaly PSYCHIATRIC: the patient is alert & oriented to person, affect & behavior appropriate  ASSESSMENT/PLAN:  Anemia of chronic kidney disease.  Hemoglobin stable.     Chronic kidney disease.  Kidney functions improved.    CPT CODE: 40981

## 2013-04-27 ENCOUNTER — Non-Acute Institutional Stay (SKILLED_NURSING_FACILITY): Payer: Medicare Other | Admitting: Internal Medicine

## 2013-04-27 DIAGNOSIS — J449 Chronic obstructive pulmonary disease, unspecified: Secondary | ICD-10-CM

## 2013-04-27 DIAGNOSIS — M545 Low back pain, unspecified: Secondary | ICD-10-CM

## 2013-04-27 DIAGNOSIS — J4489 Other specified chronic obstructive pulmonary disease: Secondary | ICD-10-CM

## 2013-04-27 DIAGNOSIS — E119 Type 2 diabetes mellitus without complications: Secondary | ICD-10-CM

## 2013-04-27 DIAGNOSIS — R609 Edema, unspecified: Secondary | ICD-10-CM

## 2013-04-27 NOTE — Progress Notes (Signed)
Patient ID: Xavier Gibson, male   DOB: 07-17-1945, 67 y.o.   MRN: [id number]        PROGRESS NOTE  DATE: 04/27/2013  FACILITY: Nursing Home Location: [facility]  LEVEL OF CARE: SNF (31)  Routine Visit  CHIEF COMPLAINT:  Manage edema, COPD, and diabetes mellitus.    HISTORY OF PRESENT ILLNESS:  REASSESSMENT OF ONGOING PROBLEM(S):  EDEMA: The patient's edema is stable. Patient denies increasing lower extremity swelling.  denies calf pain, chest pain or shortness of breath. No complications reported from the medications currently being used.  COPD: the COPD remains stable.  Pt denies sob, wheezing or declining exercise tolerance.  No complications from the medications presently being used. Patient is complaining of an increase in productive cough for several days. He cannot identify precipitating or alleviating factors. There is no temporal relationship.  DM:pt's DM remains stable.  Pt denies polyuria, polydipsia, polyphagia, changes in vision or hypoglycemic episodes.  No complications noted from the medication presently being used.  Last hemoglobin A1c is: 09/2012:  Hemoglobin A1C 6.6, in 10/14 HbA1c 5.9.  PAST MEDICAL HISTORY : Reviewed.  No changes.  CURRENT MEDICATIONS: Reviewed per [facility]  REVIEW OF SYSTEMS:  GENERAL: no change in appetite, no fatigue, no weight changes, no fever, chills or weakness RESPIRATORY: no, SOB, DOE, wheezing, hemoptysis, see HPI CARDIAC: no chest pain or palpitations;  chronic lower extremity swelling  GI: no abdominal pain, diarrhea, constipation, heart burn, nausea or vomiting  PHYSICAL EXAMINATION  VS:  T 99.5      P 62    RR 20    BP 146/84    POX %     WT (Lb) 424  GENERAL: no acute distress, morbidly obese body habitus EYES: conjunctivae normal, sclerae normal, normal eye lids NECK: supple, trachea midline, no neck masses, no thyroid tenderness, no thyromegaly LYMPHATICS: no LAN in the neck, no supraclavicular  LAN RESPIRATORY: breathing is even & unlabored, BS CTAB CARDIAC: RRR, no murmur,no extra heart sounds EDEMA/VARICOSITIES:  +4 bilateral lower extremity edema  ARTERIAL:  pedal pulses nonpalpable  GI: abdomen soft, normal BS, no masses, no tenderness, no hepatomegaly, no splenomegaly PSYCHIATRIC: the patient is alert & oriented to person, affect & behavior appropriate  LABS/RADIOLOGY:  10-14 Hb 9.2, mcv 91 ow cbc nl, bun 28, cr 1.44 ow cmp nl, FLP nl 09/2012:  BUN 33, creatinine 1.47.    Hemoglobin 9.3, MCV 91, otherwise CBC normal.    Glucose 152, BUN 40, creatinine 1.57, otherwise CMP normal.    07/2012:  Fasting lipid panel normal.    ASSESSMENT/PLAN:  Diabetes mellitus.  Well controlled.    Edema-stable.  Low back pain-denies pain.  Depression.  Stable.   COPD.  Stable.   Iron deficiency anemia.  Stable.   Hypertension.  Blood pressure borderline  Allergic rhinitis.  Well controlled.    GERD.  Well controlled.     Constipation.  Well controlled.    CPT CODE: 40981

## 2013-05-11 ENCOUNTER — Non-Acute Institutional Stay (SKILLED_NURSING_FACILITY): Payer: Medicare Other | Admitting: Internal Medicine

## 2013-05-11 ENCOUNTER — Encounter: Payer: Self-pay | Admitting: Adult Health

## 2013-05-11 DIAGNOSIS — L989 Disorder of the skin and subcutaneous tissue, unspecified: Secondary | ICD-10-CM

## 2013-05-11 NOTE — Progress Notes (Signed)
Patient ID: Xavier Gibson, male   DOB: 07/02/1945, 67 y.o.   MRN: [id number]     [facility]  No Known Allergies  Chief Complaint  Patient presents with  . Medical Managment of Chronic Issues    HPI He is being seen for the management of his chronic illnesses. Overall there has been no recent change in his recent status. There are no concerns being voiced by the nursing staff. He is not voicing any complaints or concerns.    Past Medical History  Diagnosis Date  . COPD exacerbation 10/01/2012  . Chronic airway obstruction, not elsewhere classified 11/04/2012  . Type II or unspecified type diabetes mellitus without mention of complication, not stated as uncontrolled 11/04/2012  . Chronic kidney disease, unspecified   . Depressive disorder, not elsewhere classified 11/04/2012  . Iron deficiency anemia, unspecified 11/04/2012  . Edema 01/06/2013  . Lumbago 01/29/2013  . Anemia in chronic kidney disease(285.21)     No past surgical history on file.  Filed Vitals:   10/01/12 1649  BP: 136/72  Pulse: 69  Height: 5\' 11"  (1.803 m)  Weight: 407 lb (184.614 kg)   MEDICATIONS   mvi daily Lisinopril 40 mg daily zytrec 10 mg daily  celexa 20 mg daily spirivia 18 mcg handihaler daily Lopressor 100 mg twice daily prilosec 40 mg twice daily Clonidine 0.1 mg twice daily Lasix 60 mg twice daily Iron three times daily Ventolin 1 puff four times daily Colace 300 mg daily Senna s tabs daily zocor 10 mg daily Metformin 1 gm twice daily Glipizide 5 mg daily Albuterol neb treatment three times daily as needed 02 2l CPAP  LABS REVIEWED  08-03-12: chol 161; ldl 99; trig 54 09-18-12: wbc 7.2; hgb 9.3; hct 30.4; mcv 91; plt 202; glucose 152; bun 40; creat 1.57; k+4.8; na++139 Liver normal albumin 3.7; hgb a1c 6.6 09-29-12: bun 33; creat 1.47    Review of Systems  Constitutional: Negative for malaise/fatigue.  Respiratory: Negative for cough and shortness of breath.   Cardiovascular:  Negative for chest pain and palpitations.  Gastrointestinal: Negative for heartburn and abdominal pain.  Musculoskeletal: Negative for back pain and myalgias.  Skin: Negative.   Neurological: Negative for headaches.  Psychiatric/Behavioral: Negative for depression.   Physical Exam  Constitutional: He appears well-developed and well-nourished. No distress.  obese  Neck: Neck supple. No JVD present.  Cardiovascular: Normal rate, regular rhythm and intact distal pulses.   Respiratory: Effort normal and breath sounds normal. No respiratory distress.  GI: Soft. Bowel sounds are normal. He exhibits no distension. There is no tenderness.  Musculoskeletal: He exhibits no edema.  Is able to move extremities   Neurological: He is alert.  Skin: Skin is warm. He is not diaphoretic.      ASSESSMENT/PLAN  1. Hypertension; is stable will continue lisinopril 40 mg daily; clonidine 0.1 mg twice daily and lopressor 100 mg twice daily and will monitor  2. Copd: is stable at this time: will continue spiriva daily; ventolin 1 puff four times daily and albuterol neb three times daily as needed is on chronic 02 2liters ; will continue zyrtec 10 mg daily will not make changes and will monitor   3. Sleep apnea: is stable requires CPAP at night;  Will continue current therapy and will monitor  4. Diabetes: will continue metformin 1 gm twice daily and glipizide 5 mg daily and will monitor  5. Edema: is presently controlled with lasix 60 mg twice daily does not require  k+ supplementation at this time will continue to monitor   6. Anemia: will continue iron three times daily  7. Dyslipidemia: will continue zocor 10 mg daily  8. Gerd: will continue prilosec 40 mg twice daily  9. Depression: is emotionally stable will continue celexa 20 mg daily  10. Constipation ;will continue senna 2 tabs daily; colace 300 mg daily

## 2013-05-18 ENCOUNTER — Non-Acute Institutional Stay (SKILLED_NURSING_FACILITY): Payer: Medicare Other | Admitting: Internal Medicine

## 2013-05-18 DIAGNOSIS — R609 Edema, unspecified: Secondary | ICD-10-CM

## 2013-05-18 DIAGNOSIS — I10 Essential (primary) hypertension: Secondary | ICD-10-CM

## 2013-05-18 DIAGNOSIS — J449 Chronic obstructive pulmonary disease, unspecified: Secondary | ICD-10-CM

## 2013-05-18 DIAGNOSIS — J4489 Other specified chronic obstructive pulmonary disease: Secondary | ICD-10-CM

## 2013-05-18 DIAGNOSIS — E119 Type 2 diabetes mellitus without complications: Secondary | ICD-10-CM

## 2013-05-21 ENCOUNTER — Encounter: Payer: Self-pay | Admitting: Internal Medicine

## 2013-05-21 NOTE — Progress Notes (Signed)
Patient ID: Xavier Gibson, male   DOB: 1945/10/04, 67 y.o.   MRN: [id number]        PROGRESS NOTE  DATE: 05/18/2013  FACILITY: Nursing Home Location: [facility]  LEVEL OF CARE: SNF (31)  Routine Visit  CHIEF COMPLAINT:  Manage edema, COPD, and diabetes mellitus.    HISTORY OF PRESENT ILLNESS:  REASSESSMENT OF ONGOING PROBLEM(S):  EDEMA: The patient's edema is stable. Patient denies increasing lower extremity swelling.  denies calf pain, chest pain or shortness of breath. No complications reported from the medications currently being used.  COPD: the COPD remains stable.  Pt denies sob, wheezing or declining exercise tolerance.  No complications from the medications presently being used. Patient is complaining of an increase in productive cough for several days. He cannot identify precipitating or alleviating factors. There is no temporal relationship.  DM:pt's DM remains stable.  Pt denies polyuria, polydipsia, polyphagia, changes in vision or hypoglycemic episodes.  No complications noted from the medication presently being used.  Last hemoglobin A1c is: 09/2012:  Hemoglobin A1C 6.6, in 10/14 HbA1c 5.9.  PAST MEDICAL HISTORY : Reviewed.  No changes.  CURRENT MEDICATIONS: Reviewed per [facility]  REVIEW OF SYSTEMS:  GENERAL: no change in appetite, no fatigue, no weight changes, no fever, chills or weakness RESPIRATORY: no, SOB, DOE, wheezing, hemoptysis, see HPI CARDIAC: no chest pain or palpitations;  chronic lower extremity swelling  GI: no abdominal pain, diarrhea, constipation, heart burn, nausea or vomiting  PHYSICAL EXAMINATION  VS:  T 99.5      P 62    RR 20    BP 146/84    POX %     WT (Lb) 424  GENERAL: no acute distress, morbidly obese body habitus EYES: conjunctivae normal, sclerae normal, normal eye lids NECK: supple, trachea midline, no neck masses, no thyroid tenderness, no thyromegaly LYMPHATICS: no LAN in the neck, no supraclavicular  LAN RESPIRATORY: breathing is even & unlabored, BS CTAB CARDIAC: RRR, no murmur,no extra heart sounds EDEMA/VARICOSITIES:  +4 bilateral lower extremity edema  ARTERIAL:  pedal pulses nonpalpable  GI: abdomen soft, normal BS, no masses, no tenderness, no hepatomegaly, no splenomegaly PSYCHIATRIC: the patient is alert & oriented to person, affect & behavior appropriate  LABS/RADIOLOGY:  10-14 Hb 9.2, mcv 91 ow cbc nl, bun 28, cr 1.44 ow cmp nl, FLP nl 09/2012:  BUN 33, creatinine 1.47.    Hemoglobin 9.3, MCV 91, otherwise CBC normal.    Glucose 152, BUN 40, creatinine 1.57, otherwise CMP normal.    07/2012:  Fasting lipid panel normal.    ASSESSMENT/PLAN:  Diabetes mellitus.  Well controlled.    Edema-stable.  Low back pain-denies pain.  Depression.  Stable.   COPD.  Stable.   Iron deficiency anemia.  Stable.   Hypertension.  Uncontrolled. Start hydralazine 15 mg 3 times a day.  Allergic rhinitis.  Well controlled.    GERD.  Well controlled.     Constipation.  Well controlled.    CPT CODE: 40981

## 2013-06-24 ENCOUNTER — Encounter: Payer: Self-pay | Admitting: Internal Medicine

## 2013-06-24 DIAGNOSIS — L989 Disorder of the skin and subcutaneous tissue, unspecified: Secondary | ICD-10-CM | POA: Insufficient documentation

## 2013-06-24 NOTE — Progress Notes (Signed)
Patient ID: Xavier MilchLarry W Gibson, male   DOB: June 17, 1946, 68 y.o.   MRN: [id number]        PROGRESS NOTE  DATE: 05/11/2013    [facility]  LEVEL OF CARE: SNF (31)  Acute Visit  CHIEF COMPLAINT:  Manage left forearm skin lesion.    HISTORY OF PRESENT ILLNESS: I was requested by the staff to assess the patient regarding above problem(s):  Treatment nurse reports that patient has a lesion on left forearm which has yellow edges and dark gray color.  Per patient, he has been having it for several months.  He denies pain in the area.    PAST MEDICAL HISTORY : Reviewed.  No changes.  CURRENT MEDICATIONS: [facility]  PHYSICAL EXAMINATION  GENERAL: no acute distress, morbidly obese body habitus SKIN:  INSPECTION: left forearm has a macular lesion with yellow borders, it is hyperpigmented, nontender    PSYCHIATRIC: the patient is alert & oriented to person, affect & behavior appropriate  ASSESSMENT/PLAN:  Left forearm skin lesion.  Given appearance, we will request a Dermatology consultation for possible biopsy.      THN Metrics:   Former smoker.  Not on aspirin.  BP:   146/84.    CPT CODE: [id number]

## 2013-07-01 ENCOUNTER — Non-Acute Institutional Stay (SKILLED_NURSING_FACILITY): Payer: Medicare Other | Admitting: Internal Medicine

## 2013-07-01 DIAGNOSIS — M545 Low back pain, unspecified: Secondary | ICD-10-CM

## 2013-07-01 DIAGNOSIS — J4489 Other specified chronic obstructive pulmonary disease: Secondary | ICD-10-CM

## 2013-07-01 DIAGNOSIS — E119 Type 2 diabetes mellitus without complications: Secondary | ICD-10-CM

## 2013-07-01 DIAGNOSIS — J449 Chronic obstructive pulmonary disease, unspecified: Secondary | ICD-10-CM

## 2013-07-01 DIAGNOSIS — R609 Edema, unspecified: Secondary | ICD-10-CM

## 2013-07-03 NOTE — Progress Notes (Signed)
Patient ID: Xavier Gibson, male   DOB: April 17, 1946, 68 y.o.   MRN: [id number]         PROGRESS NOTE  DATE: 07/01/2013  [facility]AC[facility]: Nursing Home [facility]  LEVEL OF CARE: SNF (31)  Routine Visit  CHIEF COMPLAINT:  Manage edema, COPD, and diabetes mellitus.    HISTORY OF PRESENT ILLNESS:  REASSESSMENT OF ONGOING PROBLEM(S):  EDEMA: The patient's edema is stable. Patient denies increasing lower extremity swelling.  denies calf pain, chest pain or shortness of breath. No complications reported from the medications currently being used.  COPD: the COPD remains stable.  Pt denies sob, wheezing or declining exercise tolerance.  No complications from the medications presently being used. Patient is complaining of an increase in productive cough for several days. He cannot identify precipitating or alleviating factors. There is no temporal relationship.  DM:pt's DM remains stable.  Pt denies polyuria, polydipsia, polyphagia, changes in vision or hypoglycemic episodes.  No complications noted from the medication presently being used.  Last hemoglobin A1c is: 09/2012:  Hemoglobin A1C 6.6, in 10/14 HbA1c 5.9.  PAST MEDICAL HISTORY : Reviewed.  No changes.  CURRENT MEDICATIONS: [facility]  REVIEW OF SYSTEMS:  GENERAL: no change in appetite, no fatigue, no weight changes, no fever, chills or weakness RESPIRATORY: no, SOB, DOE, wheezing, hemoptysis, see HPI CARDIAC: no chest pain or palpitations;  chronic lower extremity swelling  GI: no abdominal pain, diarrhea, constipation, heart burn, nausea or vomiting  PHYSICAL EXAMINATION  VS:  T 97.8      P 62    RR 18    BP 108/50    POX %     WT (Lb) 423  GENERAL: no acute distress, morbidly obese body habitus EYES: conjunctivae normal, sclerae normal, normal eye lids NECK: supple, trachea midline, no neck masses, no thyroid tenderness, no thyromegaly LYMPHATICS: no LAN in the neck, no supraclavicular  LAN RESPIRATORY: breathing is even & unlabored, BS CTAB CARDIAC: RRR, no murmur,no extra heart sounds EDEMA/VARICOSITIES:  +4 bilateral lower extremity edema  ARTERIAL:  pedal pulses nonpalpable  GI: abdomen soft, normal BS, no masses, no tenderness, no hepatomegaly, no splenomegaly PSYCHIATRIC: the patient is alert & oriented to person, affect & behavior appropriate  LABS/RADIOLOGY:  10-14 Hb 9.2, mcv 91 ow cbc nl, bun 28, cr 1.44 ow cmp nl, FLP nl 09/2012:  BUN 33, creatinine 1.47.    Hemoglobin 9.3, MCV 91, otherwise CBC normal.    Glucose 152, BUN 40, creatinine 1.57, otherwise CMP normal.    07/2012:  Fasting lipid panel normal.    ASSESSMENT/PLAN:  Diabetes mellitus.  Well controlled.    Edema-stable.  Low back pain-denies pain.  Depression.  Stable.   COPD.  Stable.   Iron deficiency anemia.  Stable.   Hypertension.  Well controlled.  Allergic rhinitis.  Well controlled.    GERD.  Well controlled.     Constipation.  Well controlled.    CPT CODE: [id number]

## 2013-07-21 ENCOUNTER — Encounter: Payer: Self-pay | Admitting: Family

## 2013-07-21 ENCOUNTER — Non-Acute Institutional Stay (SKILLED_NURSING_FACILITY): Payer: Medicare Other | Admitting: Family

## 2013-07-21 DIAGNOSIS — R609 Edema, unspecified: Secondary | ICD-10-CM

## 2013-07-21 DIAGNOSIS — J449 Chronic obstructive pulmonary disease, unspecified: Secondary | ICD-10-CM

## 2013-07-21 DIAGNOSIS — R0602 Shortness of breath: Secondary | ICD-10-CM

## 2013-07-21 DIAGNOSIS — J4489 Other specified chronic obstructive pulmonary disease: Secondary | ICD-10-CM

## 2013-07-21 NOTE — Progress Notes (Signed)
Patient ID: Xavier Gibson, male   DOB: 11-07-1945, 68 y.o.   MRN: [id number]   Date: 07/21/13 Facility: [facility]  Code Status:  Full Code  Chief Complaint  Patient presents with  . Acute Visit    SOB    HPI: Pt presents with c/o SOB with moderate exertion x one week.  Pt reports associated nausea as well; denies cough, vomiting, increase in oxygen demand, fever, chills, diarrhea, chest pain, or palpitations. Pt further endorses a dry hacking cough and generalized weakness.  Pt denies further issues/concerns       No Known Allergies    Medication List       This list is accurate as of: 07/21/13  3:07 PM.  Always use your most recent med list.               albuterol 108 (90 BASE) MCG/ACT inhaler  Commonly known as:  PROVENTIL HFA;VENTOLIN HFA  Inhale 1 puff into the lungs 4 (four) times daily.     albuterol (2.5 MG/3ML) 0.083% nebulizer solution  Commonly known as:  PROVENTIL  Take 2.5 mg by nebulization every 8 (eight) hours as needed for wheezing.     cetirizine 10 MG tablet  Commonly known as:  ZYRTEC  Take 10 mg by mouth daily.     citalopram 20 MG tablet  Commonly known as:  CELEXA  Take 20 mg by mouth daily.     cloNIDine 0.1 MG tablet  Commonly known as:  CATAPRES  Take 0.1 mg by mouth 2 (two) times daily.     docusate sodium 100 MG capsule  Commonly known as:  COLACE  Take 300 mg by mouth daily.     ferrous sulfate 325 (65 FE) MG tablet  Take 325 mg by mouth 3 (three) times daily with meals.     furosemide 20 MG tablet  Commonly known as:  LASIX  Take 60 mg by mouth 2 (two) times daily.     glipiZIDE 5 MG tablet  Commonly known as:  GLUCOTROL  Take 5 mg by mouth daily.     lisinopril 40 MG tablet  Commonly known as:  PRINIVIL,ZESTRIL  Take 40 mg by mouth daily.     metFORMIN 1000 MG tablet  Commonly known as:  GLUCOPHAGE  Take 1,000 mg by mouth 2 (two) times daily with a meal.     metoprolol 100 MG tablet  Commonly known as:   LOPRESSOR  Take 100 mg by mouth 2 (two) times daily.     multivitamin tablet  Take 1 tablet by mouth daily.     omeprazole 40 MG capsule  Commonly known as:  PRILOSEC  Take 40 mg by mouth 2 (two) times daily.     senna 8.6 MG Tabs tablet  Commonly known as:  SENOKOT  Take 2 tablets by mouth at bedtime.     simvastatin 10 MG tablet  Commonly known as:  ZOCOR  Take 10 mg by mouth at bedtime.     tiotropium 18 MCG inhalation capsule  Commonly known as:  SPIRIVA  Place 18 mcg into inhaler and inhale daily.         DATA REVIEWED   Laboratory Studies: Reviewed     Past Medical History  Diagnosis Date  . COPD exacerbation 10/01/2012  . Chronic airway obstruction, not elsewhere classified 11/04/2012  . Type II or unspecified type diabetes mellitus without mention of complication, not stated as uncontrolled 11/04/2012  . Chronic kidney disease,  unspecified 04/22/2013  . Depressive disorder, not elsewhere classified 11/04/2012  . Iron deficiency anemia, unspecified 11/04/2012  . Edema 01/06/2013  . Lumbago 01/29/2013  . Anemia in chronic kidney disease(285.21) 04/22/2013  . Essential hypertension, benign 10/01/2012    Review of Systems  HENT: Negative.   Respiratory: Positive for cough.        Dry hacking cough-nonproductive; pt reports non-adherence with CPAP  Cardiovascular: Positive for leg swelling.  Gastrointestinal: Positive for nausea.  Neurological: Positive for weakness.     Physical Exam Filed Vitals:   07/21/13 1457 07/21/13 1458  BP: 126/70   Pulse: 72   Temp: 98.4 F (36.9 C)   Resp: 20   SpO2: 96% 96%   There is no weight on file to calculate BMI. Physical Exam  Constitutional: He is oriented to person, place, and time. He appears well-developed. No distress.  Obese Male, Dressed and Groomed appropriately  Cardiovascular: Normal rate and regular rhythm.   2 + BLE  Pulmonary/Chest: He has decreased breath sounds in the right middle field, the right  lower field, the left middle field and the left lower field.  Neurological: He is alert and oriented to person, place, and time.    ASSESSMENT/PLAN  Obtain CBC w/ diff, BNP, proBNP CXR r/o infiltrates/pulmonary edema Incentive Spirometry CPAP qhs Lasix 40 mg po qd x 2 days Follow up:prn

## 2013-07-27 ENCOUNTER — Non-Acute Institutional Stay (SKILLED_NURSING_FACILITY): Payer: Medicare Other | Admitting: Internal Medicine

## 2013-07-27 DIAGNOSIS — D638 Anemia in other chronic diseases classified elsewhere: Secondary | ICD-10-CM

## 2013-07-27 DIAGNOSIS — E785 Hyperlipidemia, unspecified: Secondary | ICD-10-CM

## 2013-07-27 DIAGNOSIS — I509 Heart failure, unspecified: Secondary | ICD-10-CM

## 2013-08-06 DIAGNOSIS — I509 Heart failure, unspecified: Secondary | ICD-10-CM | POA: Insufficient documentation

## 2013-08-06 DIAGNOSIS — D638 Anemia in other chronic diseases classified elsewhere: Secondary | ICD-10-CM | POA: Insufficient documentation

## 2013-08-06 NOTE — Progress Notes (Signed)
Patient ID: Xavier Gibson, male   DOB: 11/27/1945, 68 y.o.   MRN: [id number]          PROGRESS NOTE  DATE: 07/27/2013    [facility]  LEVEL OF CARE: SNF (31)  Acute Visit  CHIEF COMPLAINT:  Manage anemia of chronic disease, hyperlipidemia, and CHF.    HISTORY OF PRESENT ILLNESS: I was requested by the staff to assess the patient regarding above problem(s):  ANEMIA: The anemia has been stable. The patient denies fatigue, melena or hematochezia. No complications from the medications currently being used.  On 07/26/2013:  Hemoglobin 9.1, MCV 95.  In 03/2013:  Hemoglobin 9.2.    HYPERLIPIDEMIA: No complications from the medications presently being used. Last fasting lipid panel showed :  On 07/26/2013:  LDL 107, otherwise fasting lipid panel normal.    CHF:The patient does not relate significant weight changes, denies sob, DOE, orthopnea, PNDs, pedal edema, palpitations or chest pain.  CHF remains stable.  No complications form the medications being used.  On 07/26/2013:  Patient's pro-BNP was 320.    PAST MEDICAL HISTORY : Reviewed.  No changes.  CURRENT MEDICATIONS: Reviewed per [facility]  REVIEW OF SYSTEMS:  GENERAL: no change in appetite, no fatigue, no weight changes, no fever, chills or weakness RESPIRATORY: no cough, SOB, DOE,, wheezing, hemoptysis CARDIAC: no chest pain or palpitations; bilateral lower extremity swelling   GI: no abdominal pain, diarrhea, constipation, heart burn, nausea or vomiting  PHYSICAL EXAMINATION  GENERAL: no acute distress, morbidly obese body habitus EYES: conjunctivae normal, sclerae normal, normal eye lids NECK: supple, trachea midline, no neck masses, no thyroid tenderness, no thyromegaly LYMPHATICS: no LAN in the neck, no supraclavicular LAN RESPIRATORY: breathing is even & unlabored, BS CTAB CARDIAC: RRR, no murmur,no extra heart sounds EDEMA/VARICOSITIES:  +4 bilateral lower extremity edema    GI: abdomen soft,  normal BS, no masses, no tenderness, no hepatomegaly, no splenomegaly PSYCHIATRIC: the patient is alert & oriented to person, affect & behavior appropriate  ASSESSMENT/PLAN:  Anemia of chronic disease.  Stable.  Continue iron.    Hyperlipidemia.  LDL is slightly elevated.  We will follow up on next panel.    CHF.  BNP is elevated.  Extra Lasix was given.    CPT CODE: [id number]     Angela CoxGayani Y Hamda Klutts, MD [facility] [phone]

## 2013-11-10 ENCOUNTER — Non-Acute Institutional Stay (SKILLED_NURSING_FACILITY): Payer: Medicare Other | Admitting: Internal Medicine

## 2013-11-10 DIAGNOSIS — M545 Low back pain, unspecified: Secondary | ICD-10-CM

## 2013-11-10 DIAGNOSIS — E119 Type 2 diabetes mellitus without complications: Secondary | ICD-10-CM

## 2013-11-10 DIAGNOSIS — J449 Chronic obstructive pulmonary disease, unspecified: Secondary | ICD-10-CM

## 2013-11-10 DIAGNOSIS — R609 Edema, unspecified: Secondary | ICD-10-CM

## 2013-11-11 NOTE — Progress Notes (Signed)
Patient ID: Xavier Gibson, male   DOB: 11/09/1945, 68 y.o.   MRN: [id number]          PROGRESS NOTE  DATE: 11/10/2013  FACILITY: Nursing Home Location: [facility]  LEVEL OF CARE: SNF (31)  Routine Visit  CHIEF COMPLAINT:  Manage edema, COPD, and diabetes mellitus.    HISTORY OF PRESENT ILLNESS:  REASSESSMENT OF ONGOING PROBLEM(S):  EDEMA: The patient's edema is stable. Patient denies increasing lower extremity swelling.  denies calf pain, chest pain or shortness of breath. No complications reported from the medications currently being used.  COPD: the COPD remains stable.  Pt denies sob, cough, wheezing or declining exercise tolerance.  No complications from the medications presently being used.  DM:pt's DM remains stable.  Pt denies polyuria, polydipsia, polyphagia, changes in vision or hypoglycemic episodes.  No complications noted from the medication presently being used.  Last hemoglobin A1c is: 09/2012:  Hemoglobin A1C 6.6, in 10/14 HbA1c 5.9, in 4-15 hemoglobin A1c 5.7.  PAST MEDICAL HISTORY : Reviewed.  No changes.  CURRENT MEDICATIONS: Reviewed per [facility]  REVIEW OF SYSTEMS:  GENERAL: no change in appetite, no fatigue, no weight changes, no fever, chills or weakness RESPIRATORY: no, SOB, DOE, wheezing, hemoptysis CARDIAC: no chest pain or palpitations;  chronic lower extremity swelling  GI: no abdominal pain, diarrhea, constipation, heart burn, nausea or vomiting  PHYSICAL EXAMINATION  VS:  See VS section  GENERAL: no acute distress, morbidly obese body habitus EYES: conjunctivae normal, sclerae normal, normal eye lids NECK: supple, trachea midline, no neck masses, no thyroid tenderness, no thyromegaly LYMPHATICS: no LAN in the neck, no supraclavicular LAN RESPIRATORY: breathing is even & unlabored, BS CTAB CARDIAC: RRR, no murmur,no extra heart sounds EDEMA/VARICOSITIES:  +4 bilateral lower extremity edema  ARTERIAL:  pedal pulses nonpalpable  GI:  abdomen soft, normal BS, no masses, no tenderness, no hepatomegaly, no splenomegaly PSYCHIATRIC: the patient is alert & oriented to person, affect & behavior appropriate  LABS/RADIOLOGY: 4-15 hemoglobin 8.4, MCV 95 otherwise CBC normal, glucose 110, BUN 31, creatinine 1.4, total protein 5.9, albumin 3.5 otherwise CMP normal 10-14 Hb 9.2, mcv 91 ow cbc nl, bun 28, cr 1.44 ow cmp nl, FLP nl 09/2012:  BUN 33, creatinine 1.47.    Hemoglobin 9.3, MCV 91, otherwise CBC normal.    Glucose 152, BUN 40, creatinine 1.57, otherwise CMP normal.    07/2012:  Fasting lipid panel normal.    ASSESSMENT/PLAN:  Diabetes mellitus.  Well controlled.    Edema-stable.  Low back pain-denies pain.  Depression.  Stable.   COPD.  Stable.   Iron deficiency anemia.  Stable.   Hypertension. Blood pressure elevated. Will review a BP log.  Allergic rhinitis.  Well controlled.    GERD.  Well controlled.     Constipation.  Well controlled.    Hyperlipidemia-check fasting lipid panel  CPT CODE: 11216  Newton Pigg. Kerry Dory, MD [facility] [phone]

## 2013-12-03 ENCOUNTER — Non-Acute Institutional Stay (SKILLED_NURSING_FACILITY): Payer: Medicare Other | Admitting: Internal Medicine

## 2013-12-03 DIAGNOSIS — J4489 Other specified chronic obstructive pulmonary disease: Secondary | ICD-10-CM

## 2013-12-03 DIAGNOSIS — D509 Iron deficiency anemia, unspecified: Secondary | ICD-10-CM

## 2013-12-03 DIAGNOSIS — J449 Chronic obstructive pulmonary disease, unspecified: Secondary | ICD-10-CM

## 2013-12-03 DIAGNOSIS — R609 Edema, unspecified: Secondary | ICD-10-CM

## 2013-12-03 DIAGNOSIS — E119 Type 2 diabetes mellitus without complications: Secondary | ICD-10-CM

## 2013-12-03 NOTE — Progress Notes (Signed)
Patient ID: Xavier MilchLarry W Pasquini, male   DOB: [birth year], 68 y.o.   MRN: [id number]         PROGRESS NOTE  DATE: 12/03/2013  FACILITY: Nursing Home Location: [facility]  LEVEL OF CARE: SNF (31)  Routine Visit  CHIEF COMPLAINT:  Manage edema, COPD, and diabetes mellitus.    HISTORY OF PRESENT ILLNESS:  REASSESSMENT OF ONGOING PROBLEM(S):  EDEMA: The patient's edema is stable. Patient denies increasing lower extremity swelling.  denies calf pain, chest pain or shortness of breath. No complications reported from the medications currently being used.  COPD: the COPD remains stable.  Pt denies sob, cough, wheezing or declining exercise tolerance.  No complications from the medications presently being used.  DM:pt's DM remains stable.  Pt denies polyuria, polydipsia, polyphagia, changes in vision or hypoglycemic episodes.  No complications noted from the medication presently being used.  Last hemoglobin A1c is: 09/2012:  Hemoglobin A1C 6.6, in 10/14 HbA1c 5.9, in 4-15 hemoglobin A1c 5.7.  PAST MEDICAL HISTORY : Reviewed.  No changes.  [facility]S: [facility]  REVIEW OF SYSTEMS:  GENERAL: no change in appetite, no fatigue, no weight changes, no fever, chills or weakness RESPIRATORY: no, SOB, DOE, wheezing, hemoptysis CARDIAC: no chest pain or palpitations;  chronic lower extremity swelling  GI: no abdominal pain, diarrhea, constipation, heart burn, nausea or vomiting  PHYSICAL EXAMINATION  VS:  See VS section  GENERAL: no acute distress, morbidly obese body habitus EYES: conjunctivae normal, sclerae normal, normal eye lids NECK: supple, trachea midline, no neck masses, no thyroid tenderness, no thyromegaly LYMPHATICS: no LAN in the neck, no supraclavicular LAN RESPIRATORY: breathing is even & unlabored, BS CTAB CARDIAC: RRR, no murmur,no extra heart sounds EDEMA/VARICOSITIES:  +4 bilateral lower extremity edema  ARTERIAL:  pedal pulses nonpalpable  GI:  abdomen soft, normal BS, no masses, no tenderness, no hepatomegaly, no splenomegaly PSYCHIATRIC: the patient is alert & oriented to person, affect & behavior appropriate  LABS/RADIOLOGY: 4-15 hemoglobin 8.4, MCV 95 otherwise CBC normal, glucose 110, BUN 31, creatinine 1.4, total protein 5.9, albumin 3.5 otherwise CMP normal 10-14 Hb 9.2, mcv 91 ow cbc nl, bun 28, cr 1.44 ow cmp nl, FLP nl 09/2012:  BUN 33, creatinine 1.47.    Hemoglobin 9.3, MCV 91, otherwise CBC normal.    Glucose 152, BUN 40, creatinine 1.57, otherwise CMP normal.    07/2012:  Fasting lipid panel normal.    ASSESSMENT/PLAN:  Diabetes mellitus.  Well controlled.    Edema-stable.  Low back pain-denies pain.  Depression.  Stable.   COPD.  Stable.   Iron deficiency anemia.  Stable.   Hypertension. Stable  Allergic rhinitis.  Well controlled.    GERD.  Well controlled.     Constipation.  Well controlled.    Hyperlipidemia-check fasting lipid panel  CPT CODE: [id number]  Newton PiggGayani Y. Kerry Doryasanayaka, MD [facility] [phone]

## 2014-01-03 ENCOUNTER — Non-Acute Institutional Stay (SKILLED_NURSING_FACILITY): Payer: Medicare Other | Admitting: Internal Medicine

## 2014-01-03 DIAGNOSIS — E119 Type 2 diabetes mellitus without complications: Secondary | ICD-10-CM

## 2014-01-03 DIAGNOSIS — R609 Edema, unspecified: Secondary | ICD-10-CM

## 2014-01-03 DIAGNOSIS — J449 Chronic obstructive pulmonary disease, unspecified: Secondary | ICD-10-CM

## 2014-01-03 DIAGNOSIS — M545 Low back pain, unspecified: Secondary | ICD-10-CM

## 2014-01-03 NOTE — Progress Notes (Signed)
Patient ID: Xavier Gibson, male   DOB: Aug 11, 1945, 68 y.o.   MRN: [id number]         PROGRESS NOTE  DATE: 01/03/2014  FACILITY: Nursing Home [facility]  LEVEL OF CARE: SNF (31)  Routine Visit  CHIEF COMPLAINT:  Manage edema, COPD, and diabetes mellitus.    HISTORY OF PRESENT ILLNESS:  REASSESSMENT OF ONGOING PROBLEM(S):  EDEMA: The patient's edema is stable. Patient denies increasing lower extremity swelling.  denies calf pain, chest pain or shortness of breath. No complications reported from the medications currently being used.  COPD: the COPD remains stable.  Pt denies sob, cough, wheezing or declining exercise tolerance.  No complications from the medications presently being used.  DM:pt's DM remains stable.  Pt denies polyuria, polydipsia, polyphagia, changes in vision or hypoglycemic episodes.  No complications noted from the medication presently being used.  Last hemoglobin A1c is: 09/2012:  Hemoglobin A1C 6.6, in 10/14 HbA1c 5.9, in 4-15 hemoglobin A1c 5.7.  PAST MEDICAL HISTORY : Reviewed.  No changes.  CURRENT MEDICATIONS: [facility]  REVIEW OF SYSTEMS:  GENERAL: no change in appetite, no fatigue, no weight changes, no fever, chills or weakness RESPIRATORY: no, SOB, DOE, wheezing, hemoptysis CARDIAC: no chest pain or palpitations;  chronic lower extremity swelling  GI: no abdominal pain, diarrhea, constipation, heart burn, nausea or vomiting  PHYSICAL EXAMINATION  VS:  See VS section  GENERAL: no acute distress, morbidly obese body habitus EYES: conjunctivae normal, sclerae normal, normal eye lids NECK: supple, trachea midline, no neck masses, no thyroid tenderness, no thyromegaly LYMPHATICS: no LAN in the neck, no supraclavicular LAN RESPIRATORY: breathing is even & unlabored, BS CTAB CARDIAC: RRR, no murmur,no extra heart sounds EDEMA/VARICOSITIES:  +4 bilateral lower extremity edema  ARTERIAL:  pedal pulses nonpalpable  GI:  abdomen soft, normal BS, no masses, no tenderness, no hepatomegaly, no splenomegaly PSYCHIATRIC: the patient is alert & oriented to person, affect & behavior appropriate  LABS/RADIOLOGY: 6-15 fasting lipid panel normal 4-15 hemoglobin 8.4, MCV 95 otherwise CBC normal, glucose 110, BUN 31, creatinine 1.4, total protein 5.9, albumin 3.5 otherwise CMP normal 10-14 Hb 9.2, mcv 91 ow cbc nl, bun 28, cr 1.44 ow cmp nl, FLP nl 09/2012:  BUN 33, creatinine 1.47.    Hemoglobin 9.3, MCV 91, otherwise CBC normal.    Glucose 152, BUN 40, creatinine 1.57, otherwise CMP normal.    07/2012:  Fasting lipid panel normal.    ASSESSMENT/PLAN:  Diabetes mellitus.  Well controlled.    Edema-stable.  Low back pain-denies pain.  Depression.  Stable.   COPD.  Stable.   Iron deficiency anemia.  Stable.   Hypertension. Last blood pressure is elevated. Will review a log.  Allergic rhinitis.  Well controlled.    GERD.  Well controlled.     Constipation.  Well controlled.    Hyperlipidemia-well controlled  Bilateral hearing loss-Start debrox.  CPT CODE: [id number]  Newton PiggGayani Y. Kerry Doryasanayaka, MD [facility] [phone]

## 2014-02-07 ENCOUNTER — Non-Acute Institutional Stay (SKILLED_NURSING_FACILITY): Payer: Medicare Other | Admitting: Internal Medicine

## 2014-02-07 DIAGNOSIS — M545 Low back pain, unspecified: Secondary | ICD-10-CM

## 2014-02-07 DIAGNOSIS — E119 Type 2 diabetes mellitus without complications: Secondary | ICD-10-CM

## 2014-02-07 DIAGNOSIS — R609 Edema, unspecified: Secondary | ICD-10-CM

## 2014-02-07 DIAGNOSIS — J449 Chronic obstructive pulmonary disease, unspecified: Secondary | ICD-10-CM

## 2014-02-07 NOTE — Progress Notes (Signed)
Patient ID: Xavier Gibson, male   DOB: June 28, 1945, 68 y.o.   MRN: [id number]         PROGRESS NOTE  DATE: 02/07/2014  FACILITY: Nursing Home Location: [facility]  LEVEL OF CARE: SNF (31)  Routine Visit  CHIEF COMPLAINT:  Manage edema, COPD, and diabetes mellitus.    HISTORY OF PRESENT ILLNESS:  REASSESSMENT OF ONGOING PROBLEM(S):  EDEMA: The patient's edema is stable. Patient denies increasing lower extremity swelling.  denies calf pain, chest pain or shortness of breath. No complications reported from the medications currently being used.  COPD: the COPD remains stable.  Pt denies sob, cough, wheezing or declining exercise tolerance.  No complications from the medications presently being used.  DM:pt's DM remains stable.  Pt denies polyuria, polydipsia, polyphagia, changes in vision or hypoglycemic episodes.  No complications noted from the medication presently being used.  Last hemoglobin A1c is: 09/2012:  Hemoglobin A1C 6.6, in 10/14 HbA1c 5.9, in 4-15 hemoglobin A1c 5.7.  PAST MEDICAL HISTORY : Reviewed.  No changes.  CURRENT MEDICATIONS: Reviewed per [facility]  REVIEW OF SYSTEMS:  GENERAL: no change in appetite, no fatigue, no weight changes, no fever, chills or weakness RESPIRATORY: no, SOB, DOE, wheezing, hemoptysis CARDIAC: no chest pain or palpitations;  chronic lower extremity swelling  GI: no abdominal pain, diarrhea, constipation, heart burn, nausea or vomiting  PHYSICAL EXAMINATION  VS:  See VS section  GENERAL: no acute distress, morbidly obese body habitus EYES: conjunctivae normal, sclerae normal, normal eye lids NECK: supple, trachea midline, no neck masses, no thyroid tenderness, no thyromegaly LYMPHATICS: no LAN in the neck, no supraclavicular LAN RESPIRATORY: breathing is even & unlabored, BS CTAB CARDIAC: RRR, no murmur,no extra heart sounds EDEMA/VARICOSITIES:  +4 bilateral lower extremity edema  ARTERIAL:  pedal pulses nonpalpable  GI:  abdomen soft, normal BS, no masses, no tenderness, no hepatomegaly, no splenomegaly PSYCHIATRIC: the patient is alert & oriented to person, affect & behavior appropriate  LABS/RADIOLOGY: 6-15 fasting lipid panel normal 4-15 hemoglobin 8.4, MCV 95 otherwise CBC normal, glucose 110, BUN 31, creatinine 1.4, total protein 5.9, albumin 3.5 otherwise CMP normal 10-14 Hb 9.2, mcv 91 ow cbc nl, bun 28, cr 1.44 ow cmp nl, FLP nl 09/2012:  BUN 33, creatinine 1.47.    Hemoglobin 9.3, MCV 91, otherwise CBC normal.    Glucose 152, BUN 40, creatinine 1.57, otherwise CMP normal.    07/2012:  Fasting lipid panel normal.    ASSESSMENT/PLAN:  Diabetes mellitus.  Well controlled.    Edema-stable.  Low back pain-denies pain.  Depression.  Stable.   COPD.  Stable.   Iron deficiency anemia.  Stable.   Hypertension. Last blood pressure is elevated.  On maximum medications. Will review a log.  Allergic rhinitis.  Well controlled.    GERD.  Well controlled.     Constipation.  Well controlled.    Hyperlipidemia-well controlled  CPT CODE: 82956  Newton Pigg. Kerry Dory, MD [facility] [phone]

## 2014-03-14 ENCOUNTER — Non-Acute Institutional Stay (SKILLED_NURSING_FACILITY): Payer: Medicare Other | Admitting: Internal Medicine

## 2014-03-14 DIAGNOSIS — R609 Edema, unspecified: Secondary | ICD-10-CM

## 2014-03-14 DIAGNOSIS — M545 Low back pain, unspecified: Secondary | ICD-10-CM

## 2014-03-14 DIAGNOSIS — E119 Type 2 diabetes mellitus without complications: Secondary | ICD-10-CM

## 2014-03-14 DIAGNOSIS — J4489 Other specified chronic obstructive pulmonary disease: Secondary | ICD-10-CM

## 2014-03-14 DIAGNOSIS — J449 Chronic obstructive pulmonary disease, unspecified: Secondary | ICD-10-CM

## 2014-03-14 NOTE — Progress Notes (Signed)
Patient ID: Xavier Gibson, male   DOB: 1946-04-27, 68 y.o.   MRN: [id number]         PROGRESS NOTE  DATE: 03/14/2014  FACILITY: Nursing Home Location: [facility]  LEVEL OF CARE: SNF (31)  Routine Visit  CHIEF COMPLAINT:  Manage edema, COPD, and diabetes mellitus.    HISTORY OF PRESENT ILLNESS:  REASSESSMENT OF ONGOING PROBLEM(S):  EDEMA: The patient's edema is stable. staff denies increasing lower extremity swelling.  denies calf pain, chest pain or shortness of breath. No complications reported from the medications currently being used. Patient is a poor historian today.  COPD: the COPD remains stable.  Staff deny sob, cough, wheezing or declining exercise tolerance.  No complications from the medications presently being used.  DM:pt's DM remains stable. Staff deny polyuria, polydipsia, polyphagia, changes in vision or hypoglycemic episodes.  No complications noted from the medication presently being used.  Last hemoglobin A1c is: 09/2012:  Hemoglobin A1C 6.6, in 10/14 HbA1c 5.9, in 4-15 hemoglobin A1c 5.7.  PAST MEDICAL HISTORY : Reviewed.  No changes.  CURRENT MEDICATIONS: Reviewed per [facility]  REVIEW OF SYSTEMS: Unobtainable-patient is a poor historian today  PHYSICAL EXAMINATION  VS:  See VS section  GENERAL: no acute distress, morbidly obese body habitus EYES: conjunctivae normal, sclerae normal, normal eye lids NECK: supple, trachea midline, no neck masses, no thyroid tenderness, no thyromegaly LYMPHATICS: no LAN in the neck, no supraclavicular LAN RESPIRATORY: breathing is even & unlabored, BS CTAB CARDIAC: RRR, no murmur,no extra heart sounds EDEMA/VARICOSITIES:  +4 bilateral lower extremity edema  ARTERIAL:  pedal pulses nonpalpable  GI: abdomen soft, normal BS, no masses, no tenderness, no hepatomegaly, no splenomegaly PSYCHIATRIC: the patient is minimally alert & unable to assess orientation, affect & behavior appropriate  LABS/RADIOLOGY: 6-15  fasting lipid panel normal 4-15 hemoglobin 8.4, MCV 95 otherwise CBC normal, glucose 110, BUN 31, creatinine 1.4, total protein 5.9, albumin 3.5 otherwise CMP normal 10-14 Hb 9.2, mcv 91 ow cbc nl, bun 28, cr 1.44 ow cmp nl, FLP nl 09/2012:  BUN 33, creatinine 1.47.    Hemoglobin 9.3, MCV 91, otherwise CBC normal.    Glucose 152, BUN 40, creatinine 1.57, otherwise CMP normal.    07/2012:  Fasting lipid panel normal.    ASSESSMENT/PLAN:  Diabetes mellitus.  Well controlled.    Edema-stable.  Low back pain-continue pain medications.  Depression.  Stable.   COPD.  Stable.   Iron deficiency anemia.  Stable.   Hypertension.  well controlled  Allergic rhinitis.  Well controlled.    GERD.  Well controlled.     Constipation.  Well controlled.    Hyperlipidemia-well controlled  CPT CODE: 21308  Newton Pigg. Kerry Dory, MD [facility] [phone]

## 2014-04-08 ENCOUNTER — Non-Acute Institutional Stay (SKILLED_NURSING_FACILITY): Payer: Medicare Other | Admitting: Internal Medicine

## 2014-04-08 DIAGNOSIS — N183 Chronic kidney disease, stage 3 unspecified: Secondary | ICD-10-CM

## 2014-04-08 DIAGNOSIS — D638 Anemia in other chronic diseases classified elsewhere: Secondary | ICD-10-CM

## 2014-04-08 DIAGNOSIS — E1121 Type 2 diabetes mellitus with diabetic nephropathy: Secondary | ICD-10-CM

## 2014-04-11 ENCOUNTER — Non-Acute Institutional Stay (SKILLED_NURSING_FACILITY): Payer: Medicare Other | Admitting: Internal Medicine

## 2014-04-11 DIAGNOSIS — M545 Low back pain, unspecified: Secondary | ICD-10-CM

## 2014-04-11 DIAGNOSIS — E119 Type 2 diabetes mellitus without complications: Secondary | ICD-10-CM

## 2014-04-11 DIAGNOSIS — J438 Other emphysema: Secondary | ICD-10-CM

## 2014-04-11 DIAGNOSIS — R609 Edema, unspecified: Secondary | ICD-10-CM

## 2014-04-12 DIAGNOSIS — E119 Type 2 diabetes mellitus without complications: Secondary | ICD-10-CM | POA: Insufficient documentation

## 2014-04-12 DIAGNOSIS — J438 Other emphysema: Secondary | ICD-10-CM | POA: Insufficient documentation

## 2014-04-12 NOTE — Progress Notes (Signed)
Patient ID: Xavier MilchLarry W Schnabel, male   DOB: 08/17/1945, 68 y.o.   MRN: [id number]         PROGRESS NOTE  DATE: 04/11/2014  FACILITY: Nursing Home [facility]  LEVEL OF CARE: SNF (31)  Routine Visit  CHIEF COMPLAINT:  Manage edema, COPD, and diabetes mellitus.    HISTORY OF PRESENT ILLNESS:  REASSESSMENT OF ONGOING PROBLEM(S):  EDEMA: The patient's edema is stable. staff denies increasing lower extremity swelling.  denies calf pain, chest pain or shortness of breath. No complications reported from the medications currently being used. Patient is a poor historian today.  COPD: the COPD remains stable.  Staff deny sob, cough, wheezing or declining exercise tolerance.  No complications from the medications presently being used.  DM:pt's DM remains stable. Staff deny polyuria, polydipsia, polyphagia, changes in vision or hypoglycemic episodes.  No complications noted from the medication presently being used.  Last hemoglobin A1c is: 09/2012:  Hemoglobin A1C 6.6, in 10/14 HbA1c 5.9, in 4-15 hemoglobin A1c 5.7, in 10-15 HbA1c 5.4.  PAST MEDICAL HISTORY : Reviewed.  No changes.  CURRENT MEDICATIONS: [facility]  REVIEW OF SYSTEMS: Unobtainable-patient is a poor historian today  PHYSICAL EXAMINATION  VS:  See VS section  GENERAL: no acute distress, morbidly obese body habitus EYES: conjunctivae normal, sclerae normal, normal eye lids NECK: supple, trachea midline, no neck masses, no thyroid tenderness, no thyromegaly LYMPHATICS: no LAN in the neck, no supraclavicular LAN RESPIRATORY: breathing is even & unlabored, BS CTAB CARDIAC: RRR, no murmur,no extra heart sounds EDEMA/VARICOSITIES:  +4 bilateral lower extremity edema  ARTERIAL:  pedal pulses nonpalpable  GI: abdomen soft, normal BS, no masses, no tenderness, no hepatomegaly, no splenomegaly PSYCHIATRIC: the patient is minimally alert & unable to assess orientation, affect & behavior  appropriate  LABS/RADIOLOGY: 10-15 Hb 7.7, mcv 91 ow cbc nl, bun 57, cr 2.59, co2 34 ow cmp nl, FLP nl 6-15 fasting lipid panel normal 4-15 hemoglobin 8.4, MCV 95 otherwise CBC normal, glucose 110, BUN 31, creatinine 1.4, total protein 5.9, albumin 3.5 otherwise CMP normal 10-14 Hb 9.2, mcv 91 ow cbc nl, bun 28, cr 1.44 ow cmp nl, FLP nl 09/2012:  BUN 33, creatinine 1.47.    Hemoglobin 9.3, MCV 91, otherwise CBC normal.    Glucose 152, BUN 40, creatinine 1.57, otherwise CMP normal.    07/2012:  Fasting lipid panel normal.    ASSESSMENT/PLAN:  Diabetes mellitus.  Well controlled.    Edema-stable.  Low back pain-continue pain medications.  Depression.  Stable.   COPD.  Stable.   Iron deficiency anemia. Unstable.  Hb declined.  Fe studies pending.  Hypertension.  well controlled  Allergic rhinitis.  Well controlled.    GERD.  Well controlled.     Constipation.  Well controlled.    Hyperlipidemia-well controlled  ARF-lasix was decreased.  Recheck pending.  CPT CODE: [id number]  Newton PiggGayani Y. Kerry Doryasanayaka, MD [facility] [phone]

## 2014-04-14 ENCOUNTER — Ambulatory Visit (HOSPITAL_COMMUNITY)
Admission: RE | Admit: 2014-04-14 | Discharge: 2014-04-14 | Disposition: A | Discharge status: Home / Self Care | Payer: Medicare Other | Source: Ambulatory Visit | Attending: Internal Medicine | Admitting: Internal Medicine

## 2014-04-14 ENCOUNTER — Other Ambulatory Visit (HOSPITAL_COMMUNITY): Payer: Self-pay | Admitting: Internal Medicine

## 2014-04-14 ENCOUNTER — Encounter (HOSPITAL_COMMUNITY): Payer: Self-pay

## 2014-04-14 DIAGNOSIS — D649 Anemia, unspecified: Secondary | ICD-10-CM | POA: Diagnosis not present

## 2014-04-14 LAB — ABO/RH: ABO/RH(D): A POS

## 2014-04-14 MED ORDER — SODIUM CHLORIDE 0.9 % IV SOLN
Freq: Once | INTRAVENOUS | Status: AC
  Administered 2014-04-14: 08:00:00 via INTRAVENOUS

## 2014-04-14 NOTE — Discharge Instructions (Signed)

## 2014-04-15 LAB — TYPE AND SCREEN
ABO/RH(D): A POS
Antibody Screen: NEGATIVE
Unit division: 0

## 2014-04-17 NOTE — Progress Notes (Addendum)
Patient ID: Xavier Gibson, male   DOB: April 06, 1946, 68 y.o.   MRN: [id number]               PROGRESS NOTE  DATE:  04/08/2014    [facility]    LEVEL OF CARE:   SNF   Acute Visit   CHIEF COMPLAINT:  Worsening chronic renal failure, anemia.    HISTORY OF PRESENT ILLNESS:  [name] is a gentleman who has been under my name.  However, his care is being done by our practice.  I have not seen him in quite a while.    He is a gentleman who has a history of morbid obesity, probably obesity hypoventilation syndrome.  He is noncompliant with his nocturnal CPAP.  In fact, he has not used it at all this month.  As I actually recall his history, I believe his sleep apnea was quite severe although I [facility] not really [facility].  He is on chronic oxygen.  I think this relates to severe obesity hypoventilation syndrome rather than COPD which seems to be all over his record.    LABORATORY DATA:   Lab work was [facility], [facility]  It is also possible that this is routine.    In any case, his BUN is 57 and creatinine 2.59 as compared to 10/02/2013 at which time his BUN was 31 and creatinine 1.46.    His hemoglobin is 7.7 with normochromic, normocytic indices.  This was 8.4 in April, 9.1 in February.    CURRENT MEDICATIONS:  Medication list is reviewed.     Zyrtec 10 mg daily.    Celexa 20 mg daily.    Spiriva 18 mcg daily.    Lopressor 100 b.i.d.    Prilosec 40 q.d.    Lasix 60 mg twice a day.    Clonidine 0.2 twice a day.    Ferrous sulfate 325 three times a day.    Apresoline 10 mg, 1-1/2 tablets/15 mg three times a day.    Albuterol inhaler 1 puff four times a day.    lisinopril 20 q.d. (reduced from 40 q.d. at the end of September).     Zocor 10 q.d.    Senokot 8.6 q.h.s.    Glucotrol 5 mg daily (metformin discontinued 03/16/2014).    REVIEW OF SYSTEMS:   GENERAL:  States his weight has gone from 39424 in October 2014 to  410 in August.   CHEST/RESPIRATORY:  The patient is not short of breath.  He denies orthopnea.  Admits to not using his nocturnal CPAP.   CARDIAC:   No chest pain.   GI:  No nausea, vomiting or diarrhea.   GU:  No dysuria.    PHYSICAL EXAMINATION:   VITAL SIGNS:   O2 SATURATIONS:  95% on 2 L.   RESPIRATIONS:  20.   PULSE:  92.   GENERAL APPEARANCE:  The patient is not in any distress.  He is alert and responsive.  T[facility]   CHEST/RESPIRATORY:  Clear air entry bilaterally.  No crackles or wheezes.   CARDIOVASCULAR:  CARDIAC:   Heart sounds are normal.  JVP is not elevated.  It is difficult at the bedside, but I would say he is euvolemic.   EDEMA/VARICOSITIES:  Extremities:  Edema below the knees.    ASSESSMENT/PLAN:  Diabetic nephropathy.  There has been worsening of the creatinine from our  last check in April of this year.  He is apparently 57 and 2.59.  His weights have gone down.  We may actually be able to reduce the Lasix.  His blood pressures have been stable in the 120s mostly over 60s range.    Anemia.  Probably of chronic disease.  He is already on iron.  I will check his iron indices.   He may very well need Procrit or Aranesp at this point/ transfusion  Type 2 diabetes.  On Glucotrol.  His metformin was appropriately stopped.  [facility]  However, I do not have any of these records.  I will see if we can fax these labs to this physician.

## 2014-06-14 ENCOUNTER — Ambulatory Visit (HOSPITAL_COMMUNITY)
Admission: RE | Admit: 2014-06-14 | Discharge: 2014-06-14 | Disposition: A | Discharge status: Home / Self Care | Payer: Medicare Other | Source: Ambulatory Visit | Attending: Internal Medicine | Admitting: Internal Medicine

## 2014-06-14 DIAGNOSIS — D638 Anemia in other chronic diseases classified elsewhere: Secondary | ICD-10-CM | POA: Insufficient documentation

## 2014-06-14 LAB — CBC WITH DIFFERENTIAL/PLATELET
BASOS ABS: 0 10*3/uL (ref 0.0–0.1)
Basophils Relative: 0 % (ref 0–1)
Eosinophils Absolute: 0.2 10*3/uL (ref 0.0–0.7)
Eosinophils Relative: 4 % (ref 0–5)
HCT: 27.9 % — ABNORMAL LOW (ref 39.0–52.0)
Hemoglobin: 8.5 g/dL — ABNORMAL LOW (ref 13.0–17.0)
LYMPHS PCT: 21 % (ref 12–46)
Lymphs Abs: 1.1 10*3/uL (ref 0.7–4.0)
MCH: 28.2 pg (ref 26.0–34.0)
MCHC: 30.5 g/dL (ref 30.0–36.0)
MCV: 92.7 fL (ref 78.0–100.0)
Monocytes Absolute: 0.4 10*3/uL (ref 0.1–1.0)
Monocytes Relative: 7 % (ref 3–12)
NEUTROS ABS: 3.7 10*3/uL (ref 1.7–7.7)
NEUTROS PCT: 68 % (ref 43–77)
Platelets: 176 10*3/uL (ref 150–400)
RBC: 3.01 MIL/uL — ABNORMAL LOW (ref 4.22–5.81)
RDW: 14.2 % (ref 11.5–15.5)
WBC: 5.4 10*3/uL (ref 4.0–10.5)

## 2014-06-14 LAB — ABO/RH: ABO/RH(D): A POS

## 2014-06-14 LAB — PREPARE RBC (CROSSMATCH)

## 2014-06-14 MED ORDER — SODIUM CHLORIDE 0.9 % IV SOLN: Freq: Once | INTRAVENOUS | Status: DC

## 2014-06-15 LAB — TYPE AND SCREEN
ABO/RH(D): A POS
Antibody Screen: NEGATIVE
UNIT DIVISION: 0
UNIT DIVISION: 0

## 2019-05-18 DEATH — deceased
